# Patient Record
Sex: Female | Born: 1995 | Race: White | Hispanic: No | Smoking: Never smoker
Health system: Southern US, Community
[De-identification: ages and names within clinical notes are randomized; demographics above are authoritative.]

## PROBLEM LIST (undated history)

## (undated) DIAGNOSIS — Z789 Other specified health status: Secondary | ICD-10-CM

## (undated) DIAGNOSIS — R55 Syncope and collapse: Secondary | ICD-10-CM

## (undated) HISTORY — DX: Syncope and collapse: R55

## (undated) HISTORY — PX: WRIST SURGERY: SHX841

---
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
## 2013-01-31 ENCOUNTER — Telehealth: Payer: Self-pay | Admitting: Family Medicine

## 2013-01-31 DIAGNOSIS — IMO0001 Reserved for inherently not codable concepts without codable children: Secondary | ICD-10-CM

## 2013-01-31 MED ORDER — DROSPIRENONE-ETHINYL ESTRADIOL 3-0.02 MG PO TABS: 1.0000 | ORAL_TABLET | Freq: Every day | ORAL | Status: DC

## 2013-01-31 NOTE — Telephone Encounter (Signed)
Patient has an apt on 02/16/2013 for a phys. Is it possible to refill her birthcontrol medication until her visit until then?  Pharmacy [id number] - [place], [place] - [address]

## 2013-01-31 NOTE — Telephone Encounter (Signed)
Okay to fill for 1 month until appointment. Sent to pharmacy

## 2013-02-09 ENCOUNTER — Ambulatory Visit (INDEPENDENT_AMBULATORY_CARE_PROVIDER_SITE_OTHER): Payer: Federal, State, Local not specified - PPO | Admitting: Psychology

## 2013-02-09 DIAGNOSIS — F4321 Adjustment disorder with depressed mood: Secondary | ICD-10-CM

## 2013-02-14 ENCOUNTER — Telehealth: Payer: Self-pay

## 2013-02-14 NOTE — Telephone Encounter (Signed)
Left message for call back Non identifiable  

## 2013-02-16 ENCOUNTER — Encounter: Payer: Federal, State, Local not specified - PPO | Admitting: Family Medicine

## 2013-02-19 NOTE — Telephone Encounter (Signed)
Patient no showed

## 2013-02-23 ENCOUNTER — Ambulatory Visit (INDEPENDENT_AMBULATORY_CARE_PROVIDER_SITE_OTHER): Payer: Federal, State, Local not specified - PPO | Admitting: Psychology

## 2013-02-23 DIAGNOSIS — F4321 Adjustment disorder with depressed mood: Secondary | ICD-10-CM

## 2013-03-19 ENCOUNTER — Telehealth: Payer: Self-pay

## 2013-03-19 NOTE — Telephone Encounter (Signed)
Left message for call back Non identifiable  Pap--11/2011 Td--08-2011

## 2013-03-21 ENCOUNTER — Encounter: Payer: Federal, State, Local not specified - PPO | Admitting: Family Medicine

## 2013-03-21 NOTE — Telephone Encounter (Signed)
Unable to reach pre visit//no show 

## 2013-06-21 ENCOUNTER — Ambulatory Visit (INDEPENDENT_AMBULATORY_CARE_PROVIDER_SITE_OTHER): Payer: Self-pay | Admitting: Internal Medicine

## 2013-06-25 ENCOUNTER — Encounter (INDEPENDENT_AMBULATORY_CARE_PROVIDER_SITE_OTHER): Payer: Self-pay | Admitting: Internal Medicine

## 2013-06-25 ENCOUNTER — Ambulatory Visit (INDEPENDENT_AMBULATORY_CARE_PROVIDER_SITE_OTHER): Payer: BLUE CROSS/BLUE SHIELD | Admitting: Internal Medicine

## 2013-06-25 VITALS — BP 100/70 | HR 79 | Ht 67.0 in | Wt 133.0 lb

## 2013-06-25 DIAGNOSIS — R55 Syncope and collapse: Secondary | ICD-10-CM

## 2013-06-25 NOTE — Progress Notes (Signed)
IN [facility] Cardiology - [facility]    Chief Complaint   Patient presents with   . Loss of Consciousness         History of Present Illness   On March 24 th, 2015. At work standing at reception desk. Sudden did not feel well ( has felt this before), got lightheaded and requested to sit down, headed toward a couch but did not make it and passed out. No injury,  Regained consciousness within seconds. Sweaty and hot but recognized everybody. Got something to eat and worked the rest of the day. Denies any associated palpitations. Has never happened while driving.     Prior to this time, she passed out at work in November of last year and before that she was at a grocery store. Happened while visiting a friend in a hospital. 6 yrs ago passed out in the shower but sustained no injuries.     She is always standing when these events occur.Occasionally gets dizzy when she stands from sitting. She has 1 older sister who does not have this condition. No association with menstrual periods.      Past Medical History     History reviewed. No pertinent past medical history.    Past Surgical History     Past Surgical History   Procedure Date   . Mouth surgery        Family History     Family History   Problem Relation Age of Onset   . Coronary artery disease Paternal Aunt        Social History     History     Social History   . Marital Status: Single     Spouse Name: N/A     Number of Children: N/A   . Years of Education: N/A     Occupational History   . Not on file.     Social History Main Topics   . Smoking status: Current Every Day Smoker   . Smokeless tobacco: Not on file   . Alcohol Use: Yes   . Drug Use:    . Sexually Active: Not on file     Other Topics Concern   . Not on file     Social History Narrative   . No narrative on file       Allergies     No Known Allergies    Medications     No current outpatient prescriptions on file prior to visit.       Review of Systems     Constitutional: Negative for fevers and chills  Skin:  No rash or lesions  Respiratory: Negative for cough, wheezing, or hemoptysis  Cardiovascular: as per HPI  Gastrointestinal: Negative for abdominal pain, nausea, vomiting and diarrhea  Musculoskeletal:  No arthritic symptoms  Genitourinary: Negative for dysuria        Physical Exam     Filed Vitals:    06/25/13 1039   BP: 99/67   Pulse: 79       Body mass index is 20.83 kg/(m^2).    General:  Patient appears their stated age, well-nourished.  Alert and in no apparent distress.  Eyes: No conjunctivitis, no purulent discharge, no lid lag  ENT:  Hearing grossly intact, Nares patent bilaterally, Lips moist, color appropriate for race.  Respiratory: Clear to auscultation and percussion throughout. Respiratory effort unlabored, chest expansion symmetric.    Cardio: Regular rate and rhythm. Normal S1/S2 No carotid bruits or thrills, no JVD.  Extremities: warm, pulses 2+, no peripheral edema  GI: Soft, nondistended, nontender.  No guarding or rebound.  Skin: Color appropriate for race, Skin warm, dry, and intact            Assessment and Plan   Vasovagal Syncope      Multiple fainting episodes since age 51. Physical examination is otherwise normal.    Plan  2D ECHO  Tilt Table Test        Janace Litten, MD  [facility]

## 2013-06-28 ENCOUNTER — Telehealth (INDEPENDENT_AMBULATORY_CARE_PROVIDER_SITE_OTHER): Payer: Self-pay

## 2013-06-28 ENCOUNTER — Other Ambulatory Visit: Payer: Self-pay | Admitting: Clinical Cardiac Electrophysiology

## 2013-06-28 DIAGNOSIS — R55 Syncope and collapse: Secondary | ICD-10-CM

## 2013-06-28 NOTE — Telephone Encounter (Signed)
I received a paper message form the front desk in reference to this patient receiving a call about a tilt table test. We no longer perform TTT therefore I reviewed her chart to see what out physician may have ordered and then called her to discuss the process. I explained she would ne receive a call from anyone and that she would have to call to set up the test. I explained that Dr. Thelma Barge entered a referral for Dr. Lujean Amel and then gave her their contact information. I also explained that they may want her to go through their process before the test. She took the information and I told her to call back if she had any issues.

## 2013-07-02 ENCOUNTER — Ambulatory Visit
Admission: RE | Admit: 2013-07-02 | Discharge: 2013-07-02 | Disposition: A | Discharge status: Home / Self Care | Payer: BLUE CROSS/BLUE SHIELD | Source: Ambulatory Visit | Attending: Clinical Cardiac Electrophysiology | Admitting: Clinical Cardiac Electrophysiology

## 2013-07-02 DIAGNOSIS — I498 Other specified cardiac arrhythmias: Secondary | ICD-10-CM | POA: Insufficient documentation

## 2013-07-02 DIAGNOSIS — R61 Generalized hyperhidrosis: Secondary | ICD-10-CM | POA: Insufficient documentation

## 2013-07-02 DIAGNOSIS — I959 Hypotension, unspecified: Secondary | ICD-10-CM | POA: Insufficient documentation

## 2013-07-02 DIAGNOSIS — R110 Nausea: Secondary | ICD-10-CM | POA: Insufficient documentation

## 2013-07-02 DIAGNOSIS — R55 Syncope and collapse: Secondary | ICD-10-CM | POA: Insufficient documentation

## 2013-07-02 DIAGNOSIS — R9439 Abnormal result of other cardiovascular function study: Secondary | ICD-10-CM | POA: Insufficient documentation

## 2013-07-02 NOTE — Discharge Instructions (Signed)
Causes of Syncope  Syncope (fainting) has many causes. Sometimes it is not serious. In other cases, syncope is a sign of a heart problem. But treatment can help.    When Syncope Is Not Serious  Your doctor may call your problem vasovagal syncope or orthostatic hypotension. These two types of syncope are not serious. They can be caused by:   Strong feelings, such as anxiety or fear. A nerve signal may briefly change your heart rate and lower your blood pressure too much.   Standing for too long. Standing may cause blood to pool in your legs. When this happens, your brain may not receive all the blood it needs.   Standing up too quickly. Your blood pressure may not adjust fast enough to changes in posture and may drop too low. Certain medications can also cause this problem.  When Heart Trouble Causes Syncope  A heart problem can decrease the amount of oxygen-rich blood that reaches the brain. Heart trouble can be serious and may even be fatal if left untreated.   A Slow Heart Rate: Electrical signals tell the chambers of the heart when to pump. But the signals may be slowed or blocked (heart block) as they travel on the heart's pathways. This can be caused by aging, scarred heart tissue, or damage from heart disease. When the heart rate slows, not enough blood is pumped.   A Fast Heart Rate: Certain problems can make the heart race. For instance, after a heart attack,also known as acute myocardial infarction, or AMI,abnormal electrical signals may be created. These signals can make the heart suddenly beat very fast. The heart pumps before the chambers can fill with blood. So less blood reaches the brain and other parts of the body. Illegal drugs, certain medications, heart disease, or an inherited condition can also cause this.   A Heart Valve Problem: Blood travels through the chambers of the heart as it is pumped. Heart valves open and close to help move blood in the right direction. But a valve may not  open or close fully, if it's hardened or scarred. As a result, less blood is pumped through the heart to the brain and body.   2000-2014 [employer], [address], [place] [address]. All rights reserved. This information is not intended as a substitute for professional medical care. Always follow your healthcare professional's instructions.      Tilt Table Testing  Tilt table testing is a simple test that helps the doctor pinpoint the cause of your fainting. It checks how changes in body position can affect your blood pressure. To do this, you are placed on a table that is tilted upward. The test tries to recreate fainting symptoms while your blood pressure and heart rate are monitored. The test can be done in a hospital or at your doctor's office.  Before Your Test  Try to arrive 15 minutes early for your appointment. This will allow time to check in. For best results, prepare for the test as directed. Keep in mind:   When you schedule the test, be sure to mention all the medications you take. Ask if you should stop taking any of them the day of the test.   A few days ahead, arrange for a ride home after the test.   After the midnight before the test, don't eat or drink anything.   On the day of the test, dress for ease and comfort. Wear a two-piece outfit, top and bottoms. You will   need to undress from the waist up and put on a short hospital gown.     You will be monitored by your healthcare team through the entire test.   During Your Test  Tilt table testing takes about 60 minutes. The testing room is kept quiet and dimly lit. During the test:   Small pads (electrodes) are put on your chest to monitor your heartbeat.   A blood pressure cuff is put on your arm.   An IV (intravenous) line may be placed in your other arm. The IV line delivers fluids.   You'll be asked to lie flat on the table. Your upper body and thighs will be held in place with straps.   The table tilts until you are  almost standing upright.   You'll remain upright for up to 60 minutes. In most cases, the test is over in 30-45 minutes.   Occasionally, people are given certain medications and retested. These medications may make you feel shaky.  After Your Test  Any medications used during the test should leave your system within 15 minutes. If you were told to skip daily medications before the test, ask if you should start taking them again. You're likely to be sent home right away. It's a good idea to have a friend or family member drive. If you fainted during the test, you may want to rest for a few hours once you're home.  Report Any Symptoms You Have During the Test   Let the doctor or technician know if you notice:   Overall weakness   Nausea   Dimmed vision   Sweating or lightheadedness   A rapid heartbeat   Any other symptoms    2000-2014 [employer], [address], [place] [address]. All rights reserved. This information is not intended as a substitute for professional medical care. Always follow your healthcare professional's instructions.

## 2013-07-02 NOTE — Progress Notes (Signed)
Pt tolerated procedure well.  Vitals signs stable and back to baseline. Pt denies pain, sob and nausea.  Dr. Kerrie Pleasure explained procedure and results to patient and grandmother who verbalized understanding.  Patient taken  via wheelchair by RN to [facility] lobby with belongings in tow to meet family for transport home.

## 2013-07-03 ENCOUNTER — Encounter (INDEPENDENT_AMBULATORY_CARE_PROVIDER_SITE_OTHER): Payer: Self-pay | Admitting: Internal Medicine

## 2013-07-03 ENCOUNTER — Ambulatory Visit (INDEPENDENT_AMBULATORY_CARE_PROVIDER_SITE_OTHER): Payer: BLUE CROSS/BLUE SHIELD | Admitting: Internal Medicine

## 2013-07-03 VITALS — BP 104/65 | HR 80 | Resp 16 | Ht 67.0 in | Wt 133.0 lb

## 2013-07-03 DIAGNOSIS — R55 Syncope and collapse: Secondary | ICD-10-CM

## 2013-07-03 NOTE — Progress Notes (Signed)
[facility]    Chief Complaint   Patient presents with   . Follow-up         History of Present Illness   Documented vasovagal reactions with positive tilt table test. Works at [employer] that involves standing for long periods of time ( advised to not stand for more that 20 minutes).      Past Medical History     Past Medical History   Diagnosis Date   . Syncope and collapse        Past Surgical History     Past Surgical History   Procedure Date   . Mouth surgery 2000     pre braces   . Broken right forearm 2001     pins and cast       Family History     Family History   Problem Relation Age of Onset   . Coronary artery disease Paternal Aunt        Social History     History     Social History   . Marital Status: Single     Spouse Name: N/A     Number of Children: N/A   . Years of Education: N/A     Occupational History   . Not on file.     Social History Main Topics   . Smoking status: Current Every Day Smoker -- 0.2 packs/day for 4 years   . Smokeless tobacco: Not on file   . Alcohol Use: No   . Drug Use: No   . Sexually Active:      Other Topics Concern   . Not on file     Social History Narrative   . No narrative on file       Allergies     No Known Allergies    Medications     Current Outpatient Prescriptions on File Prior to Visit   Medication Sig Dispense Refill   . Drospirenone-Ethinyl Estradiol (LORYNA PO) Take by mouth.           Review of Systems     Constitutional: Negative for fevers and chills  Skin: No rash or lesions  Respiratory: Negative for cough, wheezing, or hemoptysis  Cardiovascular: as per HPI  Gastrointestinal: Negative for abdominal pain, nausea, vomiting and diarrhea  Musculoskeletal:  No arthritic symptoms  Genitourinary: Negative for dysuria  Otherwise 10 point review of systems is negative.      Physical Exam     Filed Vitals:    07/03/13 1017   BP: 104/65   Pulse: 80   Resp: 16       Body mass index is 20.83 kg/(m^2).    General:  Patient appears their stated  age, well-nourished.  Alert and in no apparent distress.  Eyes: No conjunctivitis, no purulent discharge, no lid lag  ENT:  Hearing grossly intact, Nares patent bilaterally, Lips moist, color appropriate for race.  Respiratory: Clear to auscultation and percussion throughout. Respiratory effort unlabored, chest expansion symmetric.    Cardio: Regular rate and rhythm. Normal S1/S2 No carotid bruits or thrills, no JVD.  Extremities: warm, pulses 2+, no peripheral edema  GI: Soft, nondistended, nontender.  No guarding or rebound.  Skin: Color appropriate for race, Skin warm, dry, and intact        Assessment and Plan   Vasovagal Syncope      Positive Tilt Table Test after 3 minute , pulse rate dropped to 30, she developed hypotension and  near syncope.     Plan  Avoid standing in place for more that 10-15 minutes  High fluid, high salt  Avoidance of  caffeinated  beverages and free water  Midodrine or Florinef if necessary    Janace Litten, MD  [facility]

## 2013-11-15 ENCOUNTER — Telehealth: Payer: Self-pay | Admitting: Family Medicine

## 2013-11-15 NOTE — Telephone Encounter (Signed)
Patient Information:  Caller Name: Elmarie Shiley  Phone: [phone]  Patient: Meagan Navarro  Gender: Female  DOB: 16-Dec-1995  Age: 18 Years  PCP: Lelon Perla.  Pregnant: No  Office Follow Up:  Does the office need to follow up with this patient?: No  Instructions For The Office: N/A  RN Note:  Attempted to call the patient 3 times and unable to make contact to triage the sore throat.  Symptoms  Reason For Call & Symptoms: Pt. calling in with sore throat. Office has no openings and wanted the pt. triaged. Pt. was not on the phone for the conference call. Called the phone # provided and told wrong #. Called the office for additional help and was give mobile #. Called the # and VM not set up.  Reviewed Health History In EMR: N/A  Reviewed Medications In EMR: N/A  Reviewed Allergies In EMR: N/A  Reviewed Surgeries / Procedures: N/A  Date of Onset of Symptoms: 11/15/2013 OB / GYN:  LMP: Unknown  Guideline(s) Used:  No Protocol Available - Information Only  Disposition Per Guideline:   Home Care  Reason For Disposition Reached:   Information only question and nurse able to answer  Advice Given:  Call Back If:  New symptoms develop  You become worse.  Patient Will Follow Care Advice:  YES

## 2013-11-15 NOTE — Telephone Encounter (Signed)
Caller name: Rhylen  Relation to pt: self  Call back number: [phone]   Reason for call:   pt exp soar throat, stuffing nose no available acute appointments transferred to [facility]

## 2014-03-22 NOTE — L&D Delivery Note (Signed)
Delivery Note Patient rapidly progressed to 10/10/+3 after AROM. Patient pushed well for < 10 minutes. At 4:52 PM a viable female was delivered via Vaginal, Spontaneous Delivery (Presentation: Left Occiput Anterior).  APGAR: 9, 9; weight pending .   Placenta status: Intact, Spontaneous.  Cord: 3 vessels with the following complications: None.  Cord pH: n/a  Anesthesia: Epidural Local  Episiotomy: None Lacerations:  Bilateral labial, first degree perineal Suture Repair: 3.0 vicryl rapide Est. Blood Loss (mL):400  mL   Mom to postpartum.  Baby to Couplet care / Skin to Skin.  Deloise Marchant GEFFEL Serenidy Waltz 02/14/2015, 5:20 PM

## 2014-04-15 ENCOUNTER — Ambulatory Visit (INDEPENDENT_AMBULATORY_CARE_PROVIDER_SITE_OTHER): Payer: Federal, State, Local not specified - PPO | Admitting: Internal Medicine

## 2014-04-15 ENCOUNTER — Telehealth: Payer: Self-pay | Admitting: Family Medicine

## 2014-04-15 ENCOUNTER — Encounter: Payer: Self-pay | Admitting: Internal Medicine

## 2014-04-15 VITALS — BP 108/62 | HR 62 | Temp 98.3°F | Resp 12 | Ht 67.0 in | Wt 133.0 lb

## 2014-04-15 DIAGNOSIS — J069 Acute upper respiratory infection, unspecified: Secondary | ICD-10-CM

## 2014-04-15 MED ORDER — BENZONATATE 100 MG PO CAPS: 100.0000 mg | ORAL_CAPSULE | Freq: Two times a day (BID) | ORAL | Status: DC | PRN

## 2014-04-15 MED ORDER — CETIRIZINE HCL 10 MG PO TABS: 10.0000 mg | ORAL_TABLET | Freq: Every day | ORAL | Status: DC

## 2014-04-15 NOTE — Assessment & Plan Note (Signed)
Rx for zyrtec, tessalon perles. Given information. No indication for antibiotics at this time.

## 2014-04-15 NOTE — Telephone Encounter (Signed)
It would not appear she has been in for several years and would need to see me before the PCP is changed but okay with me.

## 2014-04-15 NOTE — Progress Notes (Signed)
Pre visit review using our clinic review tool, if applicable. No additional management support is needed unless otherwise documented below in the visit note. 

## 2014-04-15 NOTE — Telephone Encounter (Signed)
yes

## 2014-04-15 NOTE — Patient Instructions (Signed)
We will send in a medicine to dry up the drainage in your nose that is dripping down the throat and irritating the lungs and making you cough. We have sent in cetirizine (also called zyrtec), take 1 pill a day for the next 1-2 weeks to help this to resolve. You should be feeling better in the next week or so.   The other medicine we have sent in is called tessalon perles which help to suppress the cough impulse in the brain. You can take 1 of them up to 2 times per day.   We do not think you need an antibiotic. Call us back if you are not feeling better in about a week.  Upper Respiratory Infection, Adult An upper respiratory infection (URI) is also sometimes known as the common cold. The upper respiratory tract includes the nose, sinuses, throat, trachea, and bronchi. Bronchi are the airways leading to the lungs. Most people improve within 1 week, but symptoms can last up to 2 weeks. A residual cough may last even longer.  CAUSES Many different viruses can infect the tissues lining the upper respiratory tract. The tissues become irritated and inflamed and often become very moist. Mucus production is also common. A cold is contagious. You can easily spread the virus to others by oral contact. This includes kissing, sharing a glass, coughing, or sneezing. Touching your mouth or nose and then touching a surface, which is then touched by another person, can also spread the virus. SYMPTOMS  Symptoms typically develop 1 to 3 days after you come in contact with a cold virus. Symptoms vary from person to person. They may include:  Runny nose.  Sneezing.  Nasal congestion.  Sinus irritation.  Sore throat.  Loss of voice (laryngitis).  Cough.  Fatigue.  Muscle aches.  Loss of appetite.  Headache.  Low-grade fever. DIAGNOSIS  You might diagnose your own cold based on familiar symptoms, since most people get a cold 2 to 3 times a year. Your caregiver can confirm this based on your exam.  Most importantly, your caregiver can check that your symptoms are not due to another disease such as strep throat, sinusitis, pneumonia, asthma, or epiglottitis. Blood tests, throat tests, and X-rays are not necessary to diagnose a common cold, but they may sometimes be helpful in excluding other more serious diseases. Your caregiver will decide if any further tests are required. RISKS AND COMPLICATIONS  You may be at risk for a more severe case of the common cold if you smoke cigarettes, have chronic heart disease (such as heart failure) or lung disease (such as asthma), or if you have a weakened immune system. The very young and very old are also at risk for more serious infections. Bacterial sinusitis, middle ear infections, and bacterial pneumonia can complicate the common cold. The common cold can worsen asthma and chronic obstructive pulmonary disease (COPD). Sometimes, these complications can require emergency medical care and may be life-threatening. PREVENTION  The best way to protect against getting a cold is to practice good hygiene. Avoid oral or hand contact with people with cold symptoms. Wash your hands often if contact occurs. There is no clear evidence that vitamin C, vitamin E, echinacea, or exercise reduces the chance of developing a cold. However, it is always recommended to get plenty of rest and practice good nutrition. TREATMENT  Treatment is directed at relieving symptoms. There is no cure. Antibiotics are not effective, because the infection is caused by a virus, not by bacteria.  Treatment may include:  Increased fluid intake. Sports drinks offer valuable electrolytes, sugars, and fluids.  Breathing heated mist or steam (vaporizer or shower).  Eating chicken soup or other clear broths, and maintaining good nutrition.  Getting plenty of rest.  Using gargles or lozenges for comfort.  Controlling fevers with ibuprofen or acetaminophen as directed by your  caregiver.  Increasing usage of your inhaler if you have asthma. Zinc gel and zinc lozenges, taken in the first 24 hours of the common cold, can shorten the duration and lessen the severity of symptoms. Pain medicines may help with fever, muscle aches, and throat pain. A variety of non-prescription medicines are available to treat congestion and runny nose. Your caregiver can make recommendations and may suggest nasal or lung inhalers for other symptoms.  HOME CARE INSTRUCTIONS   Only take over-the-counter or prescription medicines for pain, discomfort, or fever as directed by your caregiver.  Use a warm mist humidifier or inhale steam from a shower to increase air moisture. This may keep secretions moist and make it easier to breathe.  Drink enough water and fluids to keep your urine clear or pale yellow.  Rest as needed.  Return to work when your temperature has returned to normal or as your caregiver advises. You may need to stay home longer to avoid infecting others. You can also use a face mask and careful hand washing to prevent spread of the virus. SEEK MEDICAL CARE IF:   After the first few days, you feel you are getting worse rather than better.  You need your caregiver's advice about medicines to control symptoms.  You develop chills, worsening shortness of breath, or brown or red sputum. These may be signs of pneumonia.  You develop yellow or brown nasal discharge or pain in the face, especially when you bend forward. These may be signs of sinusitis.  You develop a fever, swollen neck glands, pain with swallowing, or white areas in the back of your throat. These may be signs of strep throat. SEEK IMMEDIATE MEDICAL CARE IF:   You have a fever.  You develop severe or persistent headache, ear pain, sinus pain, or chest pain.  You develop wheezing, a prolonged cough, cough up blood, or have a change in your usual mucus (if you have chronic lung disease).  You develop sore  muscles or a stiff neck. Document Released: 09/01/2000 Document Revised: 05/31/2011 Document Reviewed: 06/13/2013 [facility] Patient Information 2015 [employer] [place]. This information is not intended to replace advice given to you by your health care provider. Make sure you discuss any questions you have with your health care provider.

## 2014-04-15 NOTE — Progress Notes (Signed)
   Subjective:    Patient ID: Meagan Navarro, female    DOB: 1996-02-02, 18 y.o.   MRN: [id number]  HPI The patient is a 19 YO female who comes in today for a cold. She has been coughing for about 1-2 weeks. Originally she was having some mild chills and muscle aches but those have resolved. She is still having some sinus pressure and mild headaches. She is coughing some and feels like it is non-productive. She denies SOB and wheezing. Denies chest pains, nausea, vomiting.   Review of Systems  Constitutional: Negative for fever, chills, activity change, appetite change, fatigue and unexpected weight change.  HENT: Positive for congestion, postnasal drip, rhinorrhea and sinus pressure. Negative for ear discharge, ear pain, facial swelling, sore throat and trouble swallowing.   Respiratory: Positive for cough. Negative for chest tightness, shortness of breath and wheezing.   Cardiovascular: Negative for chest pain, palpitations and leg swelling.  Gastrointestinal: Negative for nausea, abdominal pain, diarrhea, constipation and abdominal distention.  Musculoskeletal: Negative.   Neurological: Negative.       Objective:   Physical Exam  Constitutional: She is oriented to person, place, and time. She appears well-developed and well-nourished.  HENT:  Head: Normocephalic and atraumatic.  Right Ear: External ear normal.  Left Ear: External ear normal.  Oropharynx normal, nose with erythematous mucosa and clear drainage.   Eyes: EOM are normal.  Neck: Normal range of motion.  Cardiovascular: Normal rate and regular rhythm.   Pulmonary/Chest: Effort normal and breath sounds normal. No respiratory distress. She has no wheezes. She has no rales.  Mild bronchial sounds, clear with cough  Abdominal: Soft. Bowel sounds are normal.  Musculoskeletal: She exhibits no edema.  Neurological: She is alert and oriented to person, place, and time.  Skin: Skin is warm and dry.   Filed Vitals:   04/15/14  1109  BP: 108/62  Pulse: 62  Temp: 98.3 F (36.8 C)  TempSrc: Oral  Resp: 12  Height: 5\' 7"  (1.702 m)  Weight: 133 lb (60.328 kg)  SpO2: 99%      Assessment & Plan:

## 2014-04-15 NOTE — Telephone Encounter (Signed)
Patient would like to change from Dr. Laury AxonLowne to Dr. Dorise HissKollar. Is this okay?

## 2014-04-18 MED ORDER — FLUTICASONE PROPIONATE 50 MCG/ACT NA SUSP: 2.0000 | Freq: Every day | NASAL | Status: DC

## 2014-04-18 NOTE — Telephone Encounter (Signed)
Please advise, thanks.

## 2014-04-18 NOTE — Telephone Encounter (Signed)
Pt called in said that she now has ear pain and not any better.  What should she do?  Does she need to come back in?

## 2014-04-18 NOTE — Telephone Encounter (Signed)
We would recommend that she try the nose spray, will call in flonase and give this several days to work. Continue taking zyrtec as well.

## 2014-04-19 ENCOUNTER — Telehealth: Payer: Self-pay | Admitting: Internal Medicine

## 2014-04-19 NOTE — Telephone Encounter (Signed)
Patient aware and will go pick up the nose spray and will call us back next week if she does not feel better.

## 2014-04-19 NOTE — Telephone Encounter (Signed)
Left message for patient to call me back. 

## 2014-04-19 NOTE — Telephone Encounter (Signed)
Advised yesterday that we would recommend that she try the nose spray, will call in flonase and give this several days to work. Continue taking zyrtec as well. If still having problems Mon or Tues can be scheduled to be seen.

## 2014-04-19 NOTE — Telephone Encounter (Signed)
Patient states she seen Dr. Dorise HissKollar but her ears are hurting.  She would like to know if she needs to come in.  Patient states she called yesterday but I did not see any notes in the chart.

## 2014-04-22 ENCOUNTER — Telehealth: Payer: Self-pay | Admitting: Internal Medicine

## 2014-04-22 ENCOUNTER — Encounter: Payer: Self-pay | Admitting: Internal Medicine

## 2014-04-22 NOTE — Telephone Encounter (Signed)
Patient is requesting work note for 01/28/, 01/29, and 02/01. Thanks so much

## 2014-04-22 NOTE — Telephone Encounter (Signed)
Dr. Dorise HissKollar printed a note saying that patient was seen in our office. Patient will come in and pick up note.

## 2014-04-22 NOTE — Telephone Encounter (Signed)
Dr. Dorise HissKollar said this patient should have been able to go to work. She is not issuing a work note.

## 2014-05-03 ENCOUNTER — Ambulatory Visit: Payer: Federal, State, Local not specified - PPO | Admitting: Internal Medicine

## 2014-08-06 LAB — OB RESULTS CONSOLE HEPATITIS B SURFACE ANTIGEN: Hepatitis B Surface Ag: NEGATIVE

## 2014-08-06 LAB — OB RESULTS CONSOLE RUBELLA ANTIBODY, IGM: Rubella: IMMUNE

## 2014-08-06 LAB — OB RESULTS CONSOLE ABO/RH: RH Type: POSITIVE

## 2014-08-06 LAB — OB RESULTS CONSOLE ANTIBODY SCREEN: Antibody Screen: NEGATIVE

## 2014-08-06 LAB — OB RESULTS CONSOLE HIV ANTIBODY (ROUTINE TESTING): HIV: NONREACTIVE

## 2014-08-06 LAB — OB RESULTS CONSOLE RPR: RPR: NONREACTIVE

## 2014-08-06 LAB — OB RESULTS CONSOLE GC/CHLAMYDIA
Chlamydia: NEGATIVE
GC PROBE AMP, GENITAL: NEGATIVE

## 2015-01-31 ENCOUNTER — Other Ambulatory Visit: Payer: Self-pay | Admitting: Obstetrics and Gynecology

## 2015-01-31 LAB — OB RESULTS CONSOLE GBS: STREP GROUP B AG: NEGATIVE

## 2015-02-07 ENCOUNTER — Other Ambulatory Visit: Payer: Self-pay | Admitting: Obstetrics and Gynecology

## 2015-02-11 ENCOUNTER — Other Ambulatory Visit: Payer: Self-pay | Admitting: Obstetrics and Gynecology

## 2015-02-12 ENCOUNTER — Telehealth (HOSPITAL_COMMUNITY): Payer: Self-pay | Admitting: *Deleted

## 2015-02-12 ENCOUNTER — Encounter (HOSPITAL_COMMUNITY): Payer: Self-pay | Admitting: *Deleted

## 2015-02-12 NOTE — Telephone Encounter (Signed)
Preadmission screen  

## 2015-02-14 ENCOUNTER — Inpatient Hospital Stay (HOSPITAL_COMMUNITY)
Admission: RE | Admit: 2015-02-14 | Discharge: 2015-02-16 | DRG: 775 | Disposition: A | Discharge status: Home / Self Care | Payer: Federal, State, Local not specified - PPO | Source: Ambulatory Visit | Attending: Obstetrics | Admitting: Obstetrics

## 2015-02-14 ENCOUNTER — Inpatient Hospital Stay (HOSPITAL_COMMUNITY): Payer: Federal, State, Local not specified - PPO | Admitting: Anesthesiology

## 2015-02-14 ENCOUNTER — Encounter (HOSPITAL_COMMUNITY): Payer: Self-pay

## 2015-02-14 DIAGNOSIS — Z3490 Encounter for supervision of normal pregnancy, unspecified, unspecified trimester: Secondary | ICD-10-CM

## 2015-02-14 DIAGNOSIS — Z3A39 39 weeks gestation of pregnancy: Secondary | ICD-10-CM | POA: Diagnosis not present

## 2015-02-14 DIAGNOSIS — O365930 Maternal care for other known or suspected poor fetal growth, third trimester, not applicable or unspecified: Secondary | ICD-10-CM | POA: Diagnosis present

## 2015-02-14 DIAGNOSIS — IMO0002 Reserved for concepts with insufficient information to code with codable children: Secondary | ICD-10-CM | POA: Diagnosis present

## 2015-02-14 LAB — CBC
HEMATOCRIT: 35.3 % — AB (ref 36.0–46.0)
HEMOGLOBIN: 11.8 g/dL — AB (ref 12.0–15.0)
MCH: 32.6 pg (ref 26.0–34.0)
MCHC: 33.4 g/dL (ref 30.0–36.0)
MCV: 97.5 fL (ref 78.0–100.0)
Platelets: 228 10*3/uL (ref 150–400)
RBC: 3.62 MIL/uL — ABNORMAL LOW (ref 3.87–5.11)
RDW: 13.5 % (ref 11.5–15.5)
WBC: 10.5 10*3/uL (ref 4.0–10.5)

## 2015-02-14 LAB — TYPE AND SCREEN
ABO/RH(D): A POS
ANTIBODY SCREEN: NEGATIVE

## 2015-02-14 LAB — RPR: RPR Ser Ql: NONREACTIVE

## 2015-02-14 LAB — ABO/RH: ABO/RH(D): A POS

## 2015-02-14 MED ORDER — IBUPROFEN 600 MG PO TABS
600.0000 mg | ORAL_TABLET | Freq: Four times a day (QID) | ORAL | Status: DC
  Administered 2015-02-14 – 2015-02-16 (×7): 600 mg via ORAL
  Filled 2015-02-14 (×8): qty 1

## 2015-02-14 MED ORDER — SIMETHICONE 80 MG PO CHEW: 80.0000 mg | CHEWABLE_TABLET | ORAL | Status: DC | PRN

## 2015-02-14 MED ORDER — OXYCODONE-ACETAMINOPHEN 5-325 MG PO TABS: 2.0000 | ORAL_TABLET | ORAL | Status: DC | PRN

## 2015-02-14 MED ORDER — TETANUS-DIPHTH-ACELL PERTUSSIS 5-2.5-18.5 LF-MCG/0.5 IM SUSP: 0.5000 mL | Freq: Once | INTRAMUSCULAR | Status: DC

## 2015-02-14 MED ORDER — OXYTOCIN BOLUS FROM INFUSION
500.0000 mL | INTRAVENOUS | Status: DC
  Administered 2015-02-14: 500 mL via INTRAVENOUS

## 2015-02-14 MED ORDER — FLEET ENEMA 7-19 GM/118ML RE ENEM: 1.0000 | ENEMA | RECTAL | Status: DC | PRN

## 2015-02-14 MED ORDER — ACETAMINOPHEN 325 MG PO TABS
650.0000 mg | ORAL_TABLET | ORAL | Status: DC | PRN
  Administered 2015-02-14 – 2015-02-15 (×2): 650 mg via ORAL
  Filled 2015-02-14 (×2): qty 2

## 2015-02-14 MED ORDER — BUTORPHANOL TARTRATE 1 MG/ML IJ SOLN
1.0000 mg | INTRAMUSCULAR | Status: DC | PRN
  Administered 2015-02-14: 1 mg via INTRAVENOUS
  Filled 2015-02-14: qty 1

## 2015-02-14 MED ORDER — EPHEDRINE 5 MG/ML INJ
10.0000 mg | INTRAVENOUS | Status: DC | PRN
  Filled 2015-02-14: qty 2

## 2015-02-14 MED ORDER — LANOLIN HYDROUS EX OINT: TOPICAL_OINTMENT | CUTANEOUS | Status: DC | PRN

## 2015-02-14 MED ORDER — DIPHENHYDRAMINE HCL 25 MG PO CAPS: 25.0000 mg | ORAL_CAPSULE | Freq: Four times a day (QID) | ORAL | Status: DC | PRN

## 2015-02-14 MED ORDER — MISOPROSTOL 25 MCG QUARTER TABLET
25.0000 ug | ORAL_TABLET | ORAL | Status: DC | PRN
  Administered 2015-02-14: 25 ug via VAGINAL
  Filled 2015-02-14 (×3): qty 0.25
  Filled 2015-02-14: qty 1

## 2015-02-14 MED ORDER — DIPHENHYDRAMINE HCL 50 MG/ML IJ SOLN: 12.5000 mg | INTRAMUSCULAR | Status: DC | PRN

## 2015-02-14 MED ORDER — FENTANYL 2.5 MCG/ML BUPIVACAINE 1/10 % EPIDURAL INFUSION (WH - ANES)
14.0000 mL/h | INTRAMUSCULAR | Status: DC | PRN
  Administered 2015-02-14 (×2): 14 mL/h via EPIDURAL
  Filled 2015-02-14: qty 125

## 2015-02-14 MED ORDER — TERBUTALINE SULFATE 1 MG/ML IJ SOLN
0.2500 mg | Freq: Once | INTRAMUSCULAR | Status: DC | PRN
  Filled 2015-02-14: qty 1

## 2015-02-14 MED ORDER — BENZOCAINE-MENTHOL 20-0.5 % EX AERO
1.0000 "application " | INHALATION_SPRAY | CUTANEOUS | Status: DC | PRN
  Administered 2015-02-14: 1 via TOPICAL
  Filled 2015-02-14: qty 56

## 2015-02-14 MED ORDER — OXYTOCIN 40 UNITS IN LACTATED RINGERS INFUSION - SIMPLE MED: 62.5000 mL/h | INTRAVENOUS | Status: DC

## 2015-02-14 MED ORDER — LIDOCAINE HCL (PF) 1 % IJ SOLN
INTRAMUSCULAR | Status: DC | PRN
  Administered 2015-02-14: 9 mL via EPIDURAL
  Administered 2015-02-14: 8 mL

## 2015-02-14 MED ORDER — LIDOCAINE HCL (PF) 1 % IJ SOLN
30.0000 mL | INTRAMUSCULAR | Status: DC | PRN
  Administered 2015-02-14: 30 mL via SUBCUTANEOUS
  Filled 2015-02-14: qty 30

## 2015-02-14 MED ORDER — ONDANSETRON HCL 4 MG/2ML IJ SOLN: 4.0000 mg | Freq: Four times a day (QID) | INTRAMUSCULAR | Status: DC | PRN

## 2015-02-14 MED ORDER — PRENATAL MULTIVITAMIN CH
1.0000 | ORAL_TABLET | Freq: Every day | ORAL | Status: DC
  Administered 2015-02-15 – 2015-02-16 (×2): 1 via ORAL
  Filled 2015-02-14 (×2): qty 1

## 2015-02-14 MED ORDER — DIBUCAINE 1 % RE OINT: 1.0000 "application " | TOPICAL_OINTMENT | RECTAL | Status: DC | PRN

## 2015-02-14 MED ORDER — OXYTOCIN 40 UNITS IN LACTATED RINGERS INFUSION - SIMPLE MED: 1.0000 m[IU]/min | INTRAVENOUS | Status: DC

## 2015-02-14 MED ORDER — ACETAMINOPHEN 325 MG PO TABS: 650.0000 mg | ORAL_TABLET | ORAL | Status: DC | PRN

## 2015-02-14 MED ORDER — LACTATED RINGERS IV SOLN: 500.0000 mL | INTRAVENOUS | Status: DC | PRN

## 2015-02-14 MED ORDER — ONDANSETRON HCL 4 MG/2ML IJ SOLN: 4.0000 mg | INTRAMUSCULAR | Status: DC | PRN

## 2015-02-14 MED ORDER — OXYCODONE-ACETAMINOPHEN 5-325 MG PO TABS
1.0000 | ORAL_TABLET | ORAL | Status: DC | PRN
  Administered 2015-02-15: 1 via ORAL
  Filled 2015-02-14: qty 1

## 2015-02-14 MED ORDER — ONDANSETRON HCL 4 MG PO TABS: 4.0000 mg | ORAL_TABLET | ORAL | Status: DC | PRN

## 2015-02-14 MED ORDER — CITRIC ACID-SODIUM CITRATE 334-500 MG/5ML PO SOLN: 30.0000 mL | ORAL | Status: DC | PRN

## 2015-02-14 MED ORDER — SENNOSIDES-DOCUSATE SODIUM 8.6-50 MG PO TABS
2.0000 | ORAL_TABLET | ORAL | Status: DC
  Administered 2015-02-14 – 2015-02-16 (×2): 2 via ORAL
  Filled 2015-02-14 (×2): qty 2

## 2015-02-14 MED ORDER — WITCH HAZEL-GLYCERIN EX PADS: 1.0000 "application " | MEDICATED_PAD | CUTANEOUS | Status: DC | PRN

## 2015-02-14 MED ORDER — OXYTOCIN 40 UNITS IN LACTATED RINGERS INFUSION - SIMPLE MED
1.0000 m[IU]/min | INTRAVENOUS | Status: DC
  Administered 2015-02-14: 1 m[IU]/min via INTRAVENOUS
  Filled 2015-02-14: qty 1000

## 2015-02-14 MED ORDER — OXYCODONE-ACETAMINOPHEN 5-325 MG PO TABS: 1.0000 | ORAL_TABLET | ORAL | Status: DC | PRN

## 2015-02-14 MED ORDER — LACTATED RINGERS IV SOLN
INTRAVENOUS | Status: DC
  Administered 2015-02-14: 16:00:00 via INTRAVENOUS
  Administered 2015-02-14: 125 mL/h via INTRAVENOUS
  Administered 2015-02-14: 08:00:00 via INTRAVENOUS

## 2015-02-14 MED ORDER — PHENYLEPHRINE 40 MCG/ML (10ML) SYRINGE FOR IV PUSH (FOR BLOOD PRESSURE SUPPORT)
80.0000 ug | PREFILLED_SYRINGE | INTRAVENOUS | Status: DC | PRN
  Filled 2015-02-14: qty 2
  Filled 2015-02-14: qty 20

## 2015-02-14 NOTE — Lactation Note (Signed)
This note was copied from the chart of Meagan Navarro. Lactation Consultation Note Initial visit at 6 hours of age.  Last feeding was a few hours ago for 15 minutes with MBU RN assist.  Mom has flat nipples with shells and hand pump already at bedside.  Mom used hand pump to help evert nipples, but they remain flat.  Baby is showing feeding cues.  Baby has ridges on lower gumline possible cyst or natal teeth under gums.  Baby does not extend tongue past lower gumline and is biting with gloved finger assessment.  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  When baby cries a bowl shape to tongue is noted.  Assisted with football and cross cradle baby is fussy, pushing back and eager to suck.  Baby finally latched in cross cradle with teacup hold to assist latch on.  Mom has flat, but compressible breast tissue.  Mom reports a little pinching, but improved comfort.  Baby has strong rhythmic sucking for several minutes and then stimulation needed to maintain sucking.  Lc unsure if baby has tongue mobility to adequately breastfeed and will need to be reevaluated.  Mom can hand express drops and may need to work on spoon feeding.  [facility] resources given and discussed.  Encouraged to feed with early cues on demand.  Early newborn behavior discussed.  Hand expression demonstrated with colostrum visible.  Mom to call for assist as needed.    Patient Name: Meagan Meredeth IdeJulia Poellnitz WUJWJ'XToday's Date: 02/14/2015 Reason for consult: Initial assessment   Maternal Data Has patient been taught Hand Expression?: Yes Does the patient have breastfeeding experience prior to this delivery?: No  Feeding Feeding Type: Breast Fed Length of feed:  (several minutes observed)  LATCH Score/Interventions Latch: Repeated attempts needed to sustain latch, nipple held in mouth throughout feeding, stimulation needed to elicit sucking reflex. Intervention(s): Skin to skin;Teach feeding cues;Waking techniques Intervention(s): Breast  compression  Audible Swallowing: None Intervention(s): Skin to skin;Hand expression  Type of Nipple: Flat Intervention(s): Shells;Hand pump  Comfort (Breast/Nipple): Soft / non-tender     Hold (Positioning): Assistance needed to correctly position infant at breast and maintain latch. Intervention(s): Breastfeeding basics reviewed;Support Pillows;Position options;Skin to skin  LATCH Score: 5  Lactation Tools Discussed/Used     Consult Status Consult Status: Follow-up Date: 02/15/15 Follow-up type: In-patient    Jannifer RodneyShoptaw, Lynell Kussman Lynn 02/14/2015, 11:13 PM

## 2015-02-14 NOTE — Anesthesia Preprocedure Evaluation (Signed)

## 2015-02-14 NOTE — Progress Notes (Signed)
Mild discomfort w ctx. FB out 2-3h ago  Toco: q2-3 min  EFM: 130s, mod var, + accels and scalp stim w sve SVE: 5/70/-1, AROM clear fluid  A&P G1 @ 392w0d w IOl for IUGR Cont pitocin FSR

## 2015-02-14 NOTE — H&P (Signed)
19 y.o. G1P0 @ 2625w0d presents for IOL for IUGR.  Otherwise has good fetal movement and no bleeding.  No past medical history on file.  Past Surgical History  Procedure Laterality Date  . Wrist surgery      Right    OB History  Gravida Para Term Preterm AB SAB TAB Ectopic Multiple Living  1             # Outcome Date GA Lbr Len/2nd Weight Sex Delivery Anes PTL Lv  1 Current               Social History   Social History  . Marital Status: Single    Spouse Name: N/A  . Number of Children: N/A  . Years of Education: N/A   Occupational History  . student [employer]    Social History Main Topics  . Smoking status: Never Smoker   . Smokeless tobacco: Never Used  . Alcohol Use: No  . Drug Use: No  . Sexual Activity:    Partners: Male    Birth Control/ Protection: Pill   Other Topics Concern  . Not on file   Social History Narrative   Review of patient's allergies indicates no known allergies.    Prenatal Transfer Tool  Maternal Diabetes: No Genetic Screening: Normal Maternal Ultrasounds/Referrals: Abnormal:  Findings:   IUGR Fetal Ultrasounds or other Referrals:  None Maternal Substance Abuse:  No Significant Maternal Medications:  None Significant Maternal Lab Results: Lab values include: Group B Strep negative  ABO, Rh: --/--/A POS, A POS (11/25 0045) Antibody: NEG (11/25 0045) Rubella: unknown RPR: Nonreactive (05/17 0000)  HBsAg: Negative (05/17 0000)  HIV: Non-reactive (05/17 0000)  GBS: Negative (11/11 0000)    Other PNC: uncomplicated.    Filed Vitals:   02/14/15 0320 02/14/15 0634  BP: 106/69 112/68  Pulse: 63 72  Temp:  97.8 F (36.6 C)  Resp: 16 16     General:  NAD Abdomen:  soft, gravid, EFW 6# Ex:  no edema SVE:  2/50/-3, FB placed without difficulty FHTs:  130s, mod var, + accels, Cat 1 Toco:  q3-4 min  US 11/11: 5lb 11oz (<10%, AC<2%, UA dopplers wnl)  A/P   19 y.o. G1P0 4325w0d presents with IOL for IUGR S/p cytotec x1.  FB placed  without difficulty.  Will start pitocin, AROm prn Epidural upon maternal request Rubella unknown--will send  FSR/ vtx/ GBS neg  Parley Pidcock GEFFEL Jamall Strohmeier

## 2015-02-14 NOTE — Anesthesia Procedure Notes (Signed)
Epidural Patient location during procedure: OB Start time: 02/14/2015 3:51 PM End time: 02/14/2015 3:55 PM  Staffing Anesthesiologist: Leilani AbleHATCHETT, Royston Bekele Performed by: anesthesiologist   Preanesthetic Checklist Completed: patient identified, surgical consent, pre-op evaluation, timeout performed, IV checked, risks and benefits discussed and monitors and equipment checked  Epidural Patient position: sitting Prep: site prepped and draped and DuraPrep Patient monitoring: continuous pulse ox and blood pressure Approach: midline Location: L3-L4 Injection technique: LOR air  Needle:  Needle type: Tuohy  Needle gauge: 17 G Needle length: 9 cm and 9 Needle insertion depth: 5 cm cm Catheter type: closed end flexible Catheter size: 19 Gauge Catheter at skin depth: 10 cm Test dose: negative and Other  Assessment Sensory level: T9 Events: blood not aspirated, injection not painful, no injection resistance, negative IV test and no paresthesia  Additional Notes Reason for block:procedure for pain

## 2015-02-15 LAB — CBC
HCT: 29.9 % — ABNORMAL LOW (ref 36.0–46.0)
Hemoglobin: 10.1 g/dL — ABNORMAL LOW (ref 12.0–15.0)
MCH: 33.3 pg (ref 26.0–34.0)
MCHC: 33.8 g/dL (ref 30.0–36.0)
MCV: 98.7 fL (ref 78.0–100.0)
PLATELETS: 190 10*3/uL (ref 150–400)
RBC: 3.03 MIL/uL — ABNORMAL LOW (ref 3.87–5.11)
RDW: 13.3 % (ref 11.5–15.5)
WBC: 17.6 10*3/uL — ABNORMAL HIGH (ref 4.0–10.5)

## 2015-02-15 LAB — RUBELLA SCREEN: Rubella: 5.04 index (ref >0.99)

## 2015-02-15 NOTE — Discharge Instructions (Signed)
Pelvic rest x 6 weeks (no intercourse or tampons)   Do not drive until you are not taking narcotic pain medication AND you can comfortably slam on the brakes.  You have a mild anemia due to blood loss from delivery.  Please continue to take a prenatal vitamin daily.  You may experience constipation from the iron, so add a stool softener as needed.

## 2015-02-15 NOTE — Lactation Note (Signed)
This note was copied from the chart of Meagan Meredeth IdeJulia Buday. Lactation Consultation Note  Patient Name: Meagan Navarro ZOXWR'UToday's Date: 02/15/2015 Reason for consult: Follow-up assessment Baby at 18 hr of life and mom reports that he is feeding much better. She able to latch him to both breast without using the Harmony to erect nipples. She is using the shells between feedings, reviewed care of the shells. Mom reports no breast or nipple pain at this time. Discussed feeding frequency, baby behavior, breast changes, and nipple care. She did have some questions about pumping and milk storage. She has a Medela DEBP at home and plans to go back to work in January. Mom stated that she can manually express and had seen colostrum bilaterally. She is aware of OP services and support group.    Maternal Data    Feeding Feeding Type: Breast Fed  LATCH Score/Interventions Latch: Repeated attempts needed to sustain latch, nipple held in mouth throughout feeding, stimulation needed to elicit sucking reflex. Intervention(s): Skin to skin;Teach feeding cues;Waking techniques  Audible Swallowing: A few with stimulation Intervention(s): Skin to skin;Hand expression;Alternate breast massage  Type of Nipple: Everted at rest and after stimulation Intervention(s): Reverse pressure  Comfort (Breast/Nipple): Soft / non-tender     Hold (Positioning): Assistance needed to correctly position infant at breast and maintain latch. Intervention(s): Breastfeeding basics reviewed;Support Pillows;Skin to skin  LATCH Score: 7  Lactation Tools Discussed/Used     Consult Status Consult Status: Follow-up Date: 02/16/15 Follow-up type: In-patient    Rulon Eisenmengerlizabeth E Johncharles Fusselman 02/15/2015, 11:49 AM

## 2015-02-15 NOTE — Anesthesia Postprocedure Evaluation (Signed)
Anesthesia Post Note  Patient: Meagan Navarro  Procedure(s) Performed: * No procedures listed *  Patient location during evaluation: Mother Baby Anesthesia Type: Epidural Level of consciousness: awake, awake and alert, oriented and patient cooperative Pain management: pain level controlled Vital Signs Assessment: post-procedure vital signs reviewed and stable Respiratory status: spontaneous breathing Cardiovascular status: stable Postop Assessment: No headache, No backache, Patient able to bend at knees, No signs of nausea or vomiting and Adequate PO intake Anesthetic complications: no    Last Vitals:  Filed Vitals:   02/14/15 2333 02/15/15 0803  BP: 117/67   Pulse: 77   Temp: 36.4 C 37.1 C  Resp: 18 16    Last Pain:  Filed Vitals:   02/15/15 0805  PainSc: Asleep                 Kieran Nachtigal, Marylene LandAngela Draughon

## 2015-02-15 NOTE — Progress Notes (Signed)
Patient is doing well.  She is ambulating, voiding, tolerating PO.  Pain control is good.  Lochia is appropriate  Filed Vitals:   02/14/15 1801 02/14/15 1830 02/14/15 1921 02/14/15 2333  BP: 113/73 110/67 112/65 117/67  Pulse: 76 79 80 77  Temp:  98.7 F (37.1 C) 99.1 F (37.3 C) 97.5 F (36.4 C)  TempSrc:  Oral Oral Oral  Resp: 16 18 18 18   Height:      Weight:      SpO2:  98% 100%     NAD Fundus firm Ext: mild edema, symmetric  Lab Results  Component Value Date   WBC 17.6* 02/15/2015   HGB 10.1* 02/15/2015   HCT 29.9* 02/15/2015   MCV 98.7 02/15/2015   PLT 190 02/15/2015    --/--/A POS, A POS (11/25 0045)/RImmune  A/P 19 y.o. G1P1001 PPD#1 s/p TSVD. Routine care.   Expect d/c tomorrow.    [facility] GEFFEL [employer]

## 2015-02-16 ENCOUNTER — Ambulatory Visit: Payer: Self-pay

## 2015-02-16 MED ORDER — IBUPROFEN 600 MG PO TABS
600.0000 mg | ORAL_TABLET | Freq: Four times a day (QID) | ORAL | Status: DC
Start: 2015-02-16 — End: 2015-09-19

## 2015-02-16 NOTE — Progress Notes (Signed)
Baby did not feed well overnight--he will not be discharged today. Peds asked that we defer circumcision at this time until feeding better.  Relayed this information to mom--she will still be discharged, but room in

## 2015-02-16 NOTE — Discharge Summary (Signed)
Obstetric Discharge Summary Reason for Admission: induction of labor Prenatal Procedures: ultrasound Intrapartum Procedures: spontaneous vaginal delivery Postpartum Procedures: none Complications-Operative and Postpartum: 1 degree perineal laceration HEMOGLOBIN  Date Value Ref Range Status  02/15/2015 10.1* 12.0 - 15.0 g/dL Final   HCT  Date Value Ref Range Status  02/15/2015 29.9* 36.0 - 46.0 % Final    Physical Exam:  General: alert, cooperative and appears stated age 50Lochia: appropriate Uterine Fundus: firm DVT Evaluation: No evidence of DVT seen on physical exam.  Discharge Diagnoses: Term Pregnancy-delivered  Discharge Information: Date: 02/16/2015 Activity: pelvic rest Diet: routine Medications: PNV and Ibuprofen Condition: stable Instructions: refer to practice specific booklet Discharge to: home Follow-up Information    [facility] GEFF[facility] Chestine SporeLARK, MD In 4 weeks.   Specialty:  Obstetrics   Contact information:   [address] [place] [id number] [phone]       Newborn Data: Live born female  Birth Weight: 6 lb 2 oz (2778 g) APGAR: 9, 9  Home with mother.  Kemal Amores GEFFEL Laken Rog 02/16/2015, 10:07 AM

## 2015-02-16 NOTE — Lactation Note (Signed)
This note was copied from the chart of Meagan Navarro. Lactation Consultation Note Follow up visit at 49 hours of age.  Baby has had 7 feedings with a 7 hours break during the night.  Baby went 24 hours without a stool and since has had 2 with 3 voids in the past 24 hours.  Baby appears jaundice, but chart indicates normal ranges.  MBU RN reports good feeding with swallows.  Mom reports nipple pain, concerns about feedings.  Assisted with waking baby for latch STS in cross cradle hold.  Mom allows a very shallow latch.  Instructed on unlatching baby to practice for a deep latch.  Mom is not independent partially due to sleepy baby.  He barely opens mouth to suck and and a few times and stops.  Last feeding 3 1/2 hrs ago.  Baby does not cup gloved finger with tongue during suck training.  LC questions baby's ability to stimulate and empty breast adequately and discussed with mom.  Mom is frustrated and tearful.  Encouraged mom to pump now and continue for 15 minutes every 3 hours for 8 sessions in 24 hours.  Mom to also work on hand expression to collect EBM for baby to be syringe fed at this time.  Syringe finger feeding to encourage good sucking.  MOm is filling with easily expressed colostrum.  Discussed may need to add formula if baby is not improving and weight loss is not stable.  Mom understands and is pumping now with FOB at bedside.  Mom to wake baby every 2 1/2 hours to feed and call MBU RN if baby is not feeding well.  Mom may give EBM as appetizer or dessert according to how baby is doing.  Baby should be supplemented 18-25 mls with every feeding, discussed mom may slowly increase as needed.    Patient Name: Meagan Meredeth IdeJulia Raineri ZOXWR'UToday's Date: 02/16/2015 Reason for consult: Follow-up assessment;Breast/nipple pain;Difficult latch;Infant < 6lbs   Maternal Data Has patient been taught Hand Expression?: Yes  Feeding Feeding Type: Breast Fed Length of feed: 15 min  LATCH Score/Interventions Latch:  Too sleepy or reluctant, no latch achieved, no sucking elicited. Intervention(s): Skin to skin;Teach feeding cues;Waking techniques Intervention(s): Adjust position;Assist with latch;Breast massage;Breast compression  Audible Swallowing: Spontaneous and intermittent Intervention(s): Hand expression;Skin to skin;Alternate breast massage  Type of Nipple: Everted at rest and after stimulation Intervention(s): Reverse pressure  Comfort (Breast/Nipple): Soft / non-tender     Hold (Positioning): Assistance needed to correctly position infant at breast and maintain latch. Intervention(s): Breastfeeding basics reviewed;Support Pillows;Position options;Skin to skin  LATCH Score: 8  Lactation Tools Discussed/Used     Consult Status Consult Status: Follow-up Date: 02/17/15 Follow-up type: In-patient    Jannifer RodneyShoptaw, Jana Lynn 02/16/2015, 6:43 PM

## 2015-02-16 NOTE — Progress Notes (Signed)
Patient is doing well.  She is ambulating, voiding, tolerating PO.  Pain control is good.  Lochia is appropriate  Filed Vitals:   02/14/15 2333 02/15/15 0803 02/15/15 1815 02/16/15 0604  BP: 117/67 113/61 114/66 117/68  Pulse: 77 91 85 78  Temp: 97.5 F (36.4 C) 98.7 F (37.1 C) 98 F (36.7 C) 98.5 F (36.9 C)  TempSrc: Oral Oral Oral Oral  Resp: 18 16 18 16   Height:      Weight:      SpO2:        NAD Fundus firm Ext: mild edema, symmetric  Lab Results  Component Value Date   WBC 17.6* 02/15/2015   HGB 10.1* 02/15/2015   HCT 29.9* 02/15/2015   MCV 98.7 02/15/2015   PLT 190 02/15/2015    --/--/A POS, A POS (11/25 0045)/RImmune  A/P 19 y.o. G1P1001 PPD#2 s/p TSVD. Routine care.   Meeting all goals, d/c to home today  Desires circumcision. Discussed r/b/a of the procedure. Reviewed that circumcision is an elective surgical procedure and not considered medically necessary. Reviewed the risks of the procedure including the risk of infection, bleeding, damage to surrounding structures, including scrotum, shaft, urethra and head of penis, and an undesired cosmetic effect requiring additional procedures for revision. Consent signed.    [facility] GEFFEL [employer]

## 2015-02-17 ENCOUNTER — Ambulatory Visit: Payer: Self-pay

## 2015-02-17 NOTE — Lactation Note (Signed)
This note was copied from the chart of Meagan Navarro. Lactation Consultation Note  Baby is being discharged today and is currently syringe feeding. Mom has very tender nipples.  Discussed cup feeding him because his volumes are increasing. Parents agreeable to this. He did not cup feed well.  His tongue movement is limited and when he attempts to extend his tongue there is central retraction. Sucking exercises and tummy time were performed and his tongue movement increased. Because he did not cup feed well decided to try and position him at the breast using the #20 NS. It was painful for mom and he quickly fell asleep.  Increased the NS to #24.  Using and off-center latch and positioning his head so he was not enface he engaged better and mom reported increased comfort.  She used breast compressions. Long jaw excursions were noted and many swallows were heard.  He ate for a total of 30 minutes and was satisfied.  He also had a BM during the feeding. Mom was instructed to pump for 10 minutes after 6 feedings a day, perform tongue exercises, tummy time and use positioning techniques.  Encouraged OP follow-up.  Patient Name: Meagan Navarro XWRUE'AToday's Date: 02/17/2015 Reason for consult: Follow-up assessment   Maternal Data    Feeding Feeding Type: Breast Milk with Formula added Length of feed: 10 min  LATCH Score/Interventions Latch: Repeated attempts needed to sustain latch, nipple held in mouth throughout feeding, stimulation needed to elicit sucking reflex.  Audible Swallowing: Spontaneous and intermittent  Type of Nipple: Everted at rest and after stimulation  Comfort (Breast/Nipple): Soft / non-tender     Hold (Positioning): Assistance needed to correctly position infant at breast and maintain latch.  LATCH Score: 8  Lactation Tools Discussed/Used Tools: Nipple Shields Nipple shield size: 24   Consult Status      Meagan Navarro, Meagan Navarro 02/17/2015, 12:50 PM

## 2015-02-19 ENCOUNTER — Ambulatory Visit (HOSPITAL_COMMUNITY)
Admission: RE | Admit: 2015-02-19 | Discharge: 2015-02-19 | Disposition: A | Discharge status: Home / Self Care | Payer: Federal, State, Local not specified - PPO | Source: Ambulatory Visit | Attending: Obstetrics | Admitting: Obstetrics

## 2015-02-19 NOTE — Lactation Note (Signed)
Lactation Consult; Jean RosenthalJackson latched without NS to right breast- mom reports pain after a few minutes then wanted to use NS. Mom reports that feels much better. Nursed for 15 min then off to sleep Diaper changed and weighted then latched to left breast. Mom reports this one is the worst and wanted to use NS- did not want to try to latch without NS. Jean RosenthalJackson nursed for 10 min then off to sleep. Mom had fed him about 1 hour before coming to appointment. Mom reports she is bottle feeding EBM mostly during the night when dad can fed baby. Good weight gain Encouraged to continue same plan. Reviewed wide open mouth and keeping the baby close to the breast throughout the feeding- she is letting him slide to the tip at times. Has Ameda pump but reports it is hurting some times- encouraged to call company for larger flange. No further questions. Encouragement given. Reviewed BFGS as resource To call prn To see Ped tomorrow  Mother's reason for visit:  Had trouble in the hospital- sore nipples Visit Type:  Feeding assessment Appointment Notes:  Tight frenulum. using NS Consult:  Initial Lactation Consultant:  Pamelia HoitWeeks, Zarius Furr D  ________________________________________________________________________  Joan FloresBaby's Name: Brigitte PulseJackson Rivers Herrin Date of Birth: [birth year]/14/2017 Pediatrician: T/S Gender: female Gestational Age: 5921w0d (At Birth) Birth Weight: 6 lb 2 oz (2778 g) Weight at Discharge: Weight: 5 lb 14 oz (2665 g)Date of Dis[facility]: [facility]17/2015 [facility]   02/14/15 2300 02/16/15 0000 02/17/15 0020  Weight: 6 lb 1.2 oz (2756 g) 5 lb 13.1 oz (2640 g) 5 lb 14 oz (2665 g)      Weight today: 6- 2.8  2800g ________________________________________________________________________  Mother's Name: Meredeth IdeJulia Salone Type of delivery:  Vag Breastfeeding Experience:  P1  ________________________________________________________________________  Breastfeeding History (Post Discharge)  Frequency of  breastfeeding:  q 2-3 hours Duration of feeding:  30 min  Supplementation  Formula:  Volume 0 ml  Breastmilk:  Volume 3-5 oz Frequency:  3 times/day  Method:  Bottle,   Pumping  Type of pump:  Ameda Frequency:  6 times/day Volume:  5-7 oz  Infant Intake and Output Assessment  Voids:  6+ in 24 hrs.  Color:  Clear yellow  Had void and yellow stool while here for appointment Stools:  6+ in 24 hrs.  Color:  Yellow  ________________________________________________________________________  Maternal Breast Assessment  Breast:  Filling Nipple:  Erect and raw on tips  _______________________________________________________________________ Feeding Assessment/Evaluation  Initial feeding assessment:  Infant's oral assessment:  Variance- baby does not extend tongue past gumline- limited mobility, natal teeth? On front bottom gums  Positioning:  Football Right breast  LATCH documentation:  Latch:  2 = Grasps breast easily, tongue down, lips flanged, rhythmical sucking.  Audible swallowing:  1 = A few with stimulation  Type of nipple:  2 = Everted at rest and after stimulation  Comfort (Breast/Nipple):  1 = Filling, red/small blisters or bruises, mild/mod discomfort  Hold (Positioning):  1 = Assistance needed to correctly position infant at breast and maintain latch  LATCH score:  7  Attached assessment:  Deep  Lips flanged:  Yes.    Lips untucked:  Yes.    Suck assessment:  Nutritive  Tools:  Nipple shield 24 mm Instructed on use and cleaning of tool:  Yes.    Pre-feed weight:  2800 g  6-2.8. Post-feed weight:  2826 g  6- 3.7 Amount transferred:26 ml Amount supplemented:  0 ml    Nursed on the left breast for  15 min then off to sleep   Pre-feed weight:  2820 g  6-3.5 after diaper change Post-feed weight:  2844 g  6- 4.3 Amount transferred:  24 ml Amount supplemented:  0 ml    Total amount transferred:  50 ml

## 2015-07-16 ENCOUNTER — Ambulatory Visit (INDEPENDENT_AMBULATORY_CARE_PROVIDER_SITE_OTHER): Payer: Federal, State, Local not specified - PPO | Admitting: Family

## 2015-07-16 ENCOUNTER — Encounter: Payer: Self-pay | Admitting: Family

## 2015-07-16 VITALS — BP 118/70 | HR 55 | Temp 98.1°F | Ht 67.0 in | Wt 139.0 lb

## 2015-07-16 DIAGNOSIS — L259 Unspecified contact dermatitis, unspecified cause: Secondary | ICD-10-CM

## 2015-07-16 MED ORDER — TRIAMCINOLONE ACETONIDE 0.025 % EX CREA: 1.0000 "application " | TOPICAL_CREAM | Freq: Two times a day (BID) | CUTANEOUS | Status: AC

## 2015-07-16 NOTE — Patient Instructions (Signed)
Trial of topical steriod.  May take OTC benadryl or apply topical anti itch cream.  If there is no improvement in your symptoms, or if there is any worsening of symptoms, or if you have any additional concerns, please return for re-evaluation; or, if we are closed, consider going to the Emergency Room for evaluation if symptoms urgent.  Contact Dermatitis Dermatitis is redness, soreness, and swelling (inflammation) of the skin. Contact dermatitis is a reaction to certain substances that touch the skin. There are two types of contact dermatitis:   Irritant contact dermatitis. This type is caused by something that irritates your skin, such as dry hands from washing them too much. This type does not require previous exposure to the substance for a reaction to occur. This type is more common.  Allergic contact dermatitis. This type is caused by a substance that you are allergic to, such as a nickel allergy or poison ivy. This type only occurs if you have been exposed to the substance (allergen) before. Upon a repeat exposure, your body reacts to the substance. This type is less common. CAUSES  Many different substances can cause contact dermatitis. Irritant contact dermatitis is most commonly caused by exposure to:   Makeup.   Soaps.   Detergents.   Bleaches.   Acids.   Metal salts, such as nickel.  Allergic contact dermatitis is most commonly caused by exposure to:   Poisonous plants.   Chemicals.   Jewelry.   Latex.   Medicines.   Preservatives in products, such as clothing.  RISK FACTORS This condition is more likely to develop in:   People who have jobs that expose them to irritants or allergens.  People who have certain medical conditions, such as asthma or eczema.  SYMPTOMS  Symptoms of this condition may occur anywhere on your body where the irritant has touched you or is touched by you. Symptoms include:  Dryness or flaking.   Redness.   Cracks.    Itching.   Pain or a burning feeling.   Blisters.  Drainage of small amounts of blood or clear fluid from skin cracks. With allergic contact dermatitis, there may also be swelling in areas such as the eyelids, mouth, or genitals.  DIAGNOSIS  This condition is diagnosed with a medical history and physical exam. A patch skin test may be performed to help determine the cause. If the condition is related to your job, you may need to see an occupational medicine specialist. TREATMENT Treatment for this condition includes figuring out what caused the reaction and protecting your skin from further contact. Treatment may also include:   Steroid creams or ointments. Oral steroid medicines may be needed in more severe cases.  Antibiotics or antibacterial ointments, if a skin infection is present.  Antihistamine lotion or an antihistamine taken by mouth to ease itching.  A bandage (dressing). HOME CARE INSTRUCTIONS Skin Care  Moisturize your skin as needed.   Apply cool compresses to the affected areas.  Try taking a bath with:  Epsom salts. Follow the instructions on the packaging. You can get these at your local pharmacy or grocery store.  Baking soda. Pour a small amount into the bath as directed by your health care provider.  Colloidal oatmeal. Follow the instructions on the packaging. You can get this at your local pharmacy or grocery store.  Try applying baking soda paste to your skin. Stir water into baking soda until it reaches a paste-like consistency.  Do not scratch your skin.  Bathe less frequently, such as every other day.  Bathe in lukewarm water. Avoid using hot water. Medicines  Take or apply over-the-counter and prescription medicines only as told by your health care provider.   If you were prescribed an antibiotic medicine, take or apply your antibiotic as told by your health care provider. Do not stop using the antibiotic even if your condition starts  to improve. General Instructions  Keep all follow-up visits as told by your health care provider. This is important.  Avoid the substance that caused your reaction. If you do not know what caused it, keep a journal to try to track what caused it. Write down:  What you eat.  What cosmetic products you use.  What you drink.  What you wear in the affected area. This includes jewelry.  If you were given a dressing, take care of it as told by your health care provider. This includes when to change and remove it. SEEK MEDICAL CARE IF:   Your condition does not improve with treatment.  Your condition gets worse.  You have signs of infection such as swelling, tenderness, redness, soreness, or warmth in the affected area.  You have a fever.  You have new symptoms. SEEK IMMEDIATE MEDICAL CARE IF:   You have a severe headache, neck pain, or neck stiffness.  You vomit.  You feel very sleepy.  You notice red streaks coming from the affected area.  Your bone or joint underneath the affected area becomes painful after the skin has healed.  The affected area turns darker.  You have difficulty breathing.   This information is not intended to replace advice given to you by your health care provider. Make sure you discuss any questions you have with your health care provider.   Document Released: 03/05/2000 Document Revised: 11/27/2014 Document Reviewed: 07/24/2014 Elsevier Interactive Patient Education [employer]

## 2015-07-16 NOTE — Progress Notes (Signed)
Subjective:    Patient ID: Meagan Navarro, female    DOB: February 10, 1996, 20 y.o.   MRN: [id number]   Meagan Navarro is a 20 y.o. female who presents today for an acute visit.    HPI Comments: Patient here is for redness, swelling on right arm x one week, worsening. Initially noted pinpoint scab and 'itched it off'. Becoming more red and pruritic.Has tried anti itch cream with some relief. Main symptom is itching. No draining.Denies exposure poison ivy, new detergents/lotions.   Denies fever, chills.  H/o allergies.   No h/o MRSA, asthma, eczema.   No past medical history on file. Review of patient's allergies indicates no known allergies. Current Outpatient Prescriptions on File Prior to Visit  Medication Sig Dispense Refill  . cetirizine (ZYRTEC) 10 MG tablet Take 1 tablet (10 mg total) by mouth daily. (Patient taking differently: Take 10 mg by mouth daily as needed. ) 30 tablet 11  . ibuprofen (ADVIL,MOTRIN) 600 MG tablet Take 1 tablet (600 mg total) by mouth every 6 (six) hours. (Patient not taking: Reported on 07/16/2015) 40 tablet 0  . Prenatal Vit-Fe Fumarate-FA (PRENATAL MULTIVITAMIN) TABS tablet Take 1 tablet by mouth daily at 12 noon. Reported on 07/16/2015     No current facility-administered medications on file prior to visit.    Social History  Substance Use Topics  . Smoking status: Never Smoker   . Smokeless tobacco: Never Used  . Alcohol Use: No    Review of Systems  Constitutional: Negative for fever, chills and unexpected weight change.  HENT: Negative for congestion, ear pain, sinus pressure and sore throat.   Eyes: Negative for visual disturbance.  Respiratory: Negative for cough, shortness of breath and wheezing.   Cardiovascular: Negative for chest pain, palpitations and leg swelling.  Gastrointestinal: Negative for nausea and vomiting.  Musculoskeletal: Negative for myalgias, joint swelling and arthralgias.  Skin: Positive for rash.  Neurological: Negative for  headaches.  Hematological: Negative for adenopathy.      Objective:    BP 118/70 mmHg  Pulse 55  Temp(Src) 98.1 F (36.7 C) (Oral)  Ht  (1.702 m)  Wt 139 lb (63.05 kg)  BMI 21.77 kg/m2  SpO2 98%   Physical Exam  Constitutional: She appears well-developed and well-nourished.  Eyes: Conjunctivae are normal.  Cardiovascular: Normal rate, regular rhythm, normal heart sounds and normal pulses.   Pulmonary/Chest: Effort normal and breath sounds normal. She has no wheezes. She has no rhonchi. She has no rales.  Neurological: She is alert.  Skin: Skin is warm and dry. Rash noted. No bruising noted. Rash is maculopapular. Rash is not nodular and not vesicular.     Maculopapular rash with erythematous base approx 5cm x 7cm size area on right forearm as noted on diagram. Excoriated from scratching. No streaking, increased warmth, drainage. No bullseye. No scab. Skin intact.   Psychiatric: She has a normal mood and affect. Her speech is normal and behavior is normal. Thought content normal.  Vitals reviewed.      Assessment & Plan:   1. Working diagnosis of Contact dermatitis No evidence of bacterial infection to warrant antibiotics. Trial topical steroid.   - triamcinolone (KENALOG) 0.025 % cream; Apply 1 application topically 2 (two) times daily.  Dispense: 453.6 g; Refill: 0   I am having Ms. Benna Dunks start on triamcinolone. I am also having her maintain her cetirizine, prenatal multivitamin, and ibuprofen.   Meds ordered this encounter  Medications  . triamcinolone (KENALOG) 0.025 %  cream    Sig: Apply 1 application topically 2 (two) times daily.    Dispense:  453.6 g    Refill:  0    Order Specific Question:  Supervising Provider    Answer:  Tresa GarterPLOTNIKOV, ALEKSEI V [[id number]]     Start medications as prescribed and explained to patient on After Visit Summary ( AVS). Risks, benefits, and alternatives of the medications and treatment plan prescribed today were discussed, and  patient expressed understanding.   Education regarding symptom management and diagnosis given to patient.   Follow-up:Plan follow-up as discussed or as needed if any worsening symptoms or change in condition. No Follow-up on file.   Continue to follow with Myrlene BrokerElizabeth A Crawford, MD for routine health maintenance.   Meredeth IdeJulia Donofrio and I agreed with plan.   Rennie PlowmanMargaret Saraiyah Hemminger, FNP

## 2015-07-16 NOTE — Progress Notes (Signed)
Pre visit review using our clinic review tool, if applicable. No additional management support is needed unless otherwise documented below in the visit note. 

## 2015-09-19 ENCOUNTER — Ambulatory Visit (INDEPENDENT_AMBULATORY_CARE_PROVIDER_SITE_OTHER)
Admission: RE | Admit: 2015-09-19 | Discharge: 2015-09-19 | Disposition: A | Discharge status: Home / Self Care | Payer: Federal, State, Local not specified - PPO | Source: Ambulatory Visit | Attending: Family | Admitting: Family

## 2015-09-19 ENCOUNTER — Encounter: Payer: Self-pay | Admitting: Family

## 2015-09-19 ENCOUNTER — Ambulatory Visit (INDEPENDENT_AMBULATORY_CARE_PROVIDER_SITE_OTHER): Payer: Federal, State, Local not specified - PPO | Admitting: Family

## 2015-09-19 VITALS — BP 110/70 | HR 75 | Temp 98.0°F | Resp 16 | Ht 67.0 in | Wt 141.0 lb

## 2015-09-19 DIAGNOSIS — M25532 Pain in left wrist: Secondary | ICD-10-CM

## 2015-09-19 DIAGNOSIS — M6248 Contracture of muscle, other site: Secondary | ICD-10-CM

## 2015-09-19 DIAGNOSIS — M62838 Other muscle spasm: Secondary | ICD-10-CM | POA: Insufficient documentation

## 2015-09-19 MED ORDER — DICLOFENAC SODIUM 2 % TD SOLN: 1.0000 "application " | Freq: Two times a day (BID) | TRANSDERMAL | Status: DC | PRN

## 2015-09-19 MED ORDER — IBUPROFEN-FAMOTIDINE 800-26.6 MG PO TABS: 1.0000 | ORAL_TABLET | Freq: Three times a day (TID) | ORAL | Status: DC | PRN

## 2015-09-19 MED ORDER — CYCLOBENZAPRINE HCL 5 MG PO TABS: 5.0000 mg | ORAL_TABLET | Freq: Three times a day (TID) | ORAL | Status: DC | PRN

## 2015-09-19 MED ORDER — TIZANIDINE HCL 4 MG PO TABS: 4.0000 mg | ORAL_TABLET | Freq: Four times a day (QID) | ORAL | Status: DC | PRN

## 2015-09-19 NOTE — Progress Notes (Signed)
Pre visit review using our clinic review tool, if applicable. No additional management support is needed unless otherwise documented below in the visit note. 
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
## 2015-09-19 NOTE — Assessment & Plan Note (Signed)
Symptoms and exam consistent with left wrist sprain although concern for possible hairline fracture given mild tenderness of radial styloid and mild inflammation at the site. Obtain x-rays. Treat conservatively with ice and wrist splint. Start Duexis and Pennsaid. Follow up in 3 weeks or sooner if needed.
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
## 2015-09-19 NOTE — Addendum Note (Signed)
Addended by: Jeanine LuzALONE, [place] Condon D on: 09/19/2015 01:06 PM   Modules accepted: Orders, Medications
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
## 2016-08-12 ENCOUNTER — Ambulatory Visit: Payer: Federal, State, Local not specified - PPO | Admitting: Internal Medicine
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
## 2019-10-26 NOTE — Progress Notes (Signed)
 State Department of Health notified of results per regulations

## 2019-10-26 NOTE — Progress Notes (Signed)
 Patient was reached and results were delivered.

## 2022-03-30 ENCOUNTER — Ambulatory Visit (INDEPENDENT_AMBULATORY_CARE_PROVIDER_SITE_OTHER): Payer: 59 | Admitting: Psychology

## 2022-03-30 DIAGNOSIS — F4322 Adjustment disorder with anxiety: Secondary | ICD-10-CM | POA: Diagnosis not present
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
## 2022-04-26 ENCOUNTER — Ambulatory Visit (INDEPENDENT_AMBULATORY_CARE_PROVIDER_SITE_OTHER): Payer: 59 | Admitting: Psychology

## 2022-04-26 DIAGNOSIS — F4322 Adjustment disorder with anxiety: Secondary | ICD-10-CM | POA: Diagnosis not present

## 2022-04-26 NOTE — Progress Notes (Signed)
[facility] Counselor Initial Adult Exam  Name: Meagan Navarro Date: 04/26/2022 MRN: [id number] DOB: Jul 23, 1995 PCP: No primary care provider on file.     Melina Fiddler participated from home, via video, and consented to treatment. Therapist participated from home office.    Guardian/Payee:  N/A    Paperwork requested: No   Reason for Visit /Presenting Problem: Anxiety about significant life changes  Mental Status Exam: Appearance:   Casual     Behavior:  Appropriate  Motor:  Normal  Speech/Language:   Clear and Coherent  Affect:  Appropriate  Mood:  anxious  Thought process:  normal  Thought content:    WNL  Sensory/Perceptual disturbances:    WNL  Orientation:  oriented to person, place, and situation  Attention:  Good  Concentration:  Good  Memory:  WNL  Fund of knowledge:   Good  Insight:    Good  Judgment:   Good  Impulse Control:  Good    Reported Symptoms:  worry, anxiety  Risk Assessment: Danger to Self:  No Self-injurious Behavior: No Danger to Others: No Duty to Warn:no Physical Aggression / Violence:No  Access to Firearms a concern:  unknown Gang Involvement:No  Patient / guardian was educated about steps to take if suicide or homicide risk level increases between visits: no While future psychiatric events cannot be accurately predicted, the patient does not currently require acute inpatient psychiatric care and does not currently meet [facility] involuntary commitment criteria.  Substance Abuse History: Current substance abuse: Yes     Past Psychiatric History:   Previous psychological history is significant for substance abuse Outpatient Providers:Cressida Milford, Ph.D. History of Psych Hospitalization: No  Psychological Testing:  N/A    Abuse History:  Victim of: No.,  N/A    Report needed: No. Victim of Neglect:No. Perpetrator of  N/A   Witness / Exposure to Domestic Violence: No   Protective Services Involvement: No   Witness to [employer] Violence:  No   Family History:  Family History  Problem Relation Age of Onset   Alcohol abuse Paternal Uncle    Drug abuse Paternal Uncle        IV drugs   HIV Paternal Uncle     Living situation: the patient lives with their partner  Sexual Orientation: Straight  Relationship Status: co-habitating  Name of spouse / other:Meagan Navarro If a parent, number of children / ages:son, age 29  Support Systems: spouse parents  Financial Stress:  No   Income/Employment/Disability: Employment  Armed forces logistics/support/administrative officer: No   Educational History: Education: some college  Religion/Sprituality/World View: unknown  Any cultural differences that may affect / interfere with treatment:  not applicable   Recreation/Hobbies: unknown  Stressors: Other: co-parenting concerns    Strengths: Supportive Relationships and Family  Barriers:  undetermined   Legal History: Pending legal issue / charges: The patient has no significant history of legal issues. History of legal issue / charges:  N/A  Medical History/Surgical History: not relevant No past medical history on file.  Past Surgical History:  Procedure Laterality Date   WRIST SURGERY     Right    Medications: Current Outpatient Medications  Medication Sig Dispense Refill   cetirizine (ZYRTEC) 10 MG tablet Take 1 tablet (10 mg total) by mouth daily. (Patient taking differently: Take 10 mg by mouth daily as needed. ) 30 tablet 11   cyclobenzaprine (FLEXERIL) 5 MG tablet Take 1 tablet (5 mg total) by mouth 3 (three) times daily as  needed for muscle spasms. 30 tablet 1   Diclofenac Sodium (PENNSAID) 2 % SOLN Place 1 application onto the skin 2 (two) times daily as needed. 112 g 1   Ibuprofen-Famotidine 800-26.6 MG TABS Take 1 tablet by mouth 3 (three) times daily as needed. 90 tablet 1   No current facility-administered medications for this visit.    No Known Allergies Patient was seen via video (WebEx).    Initial note: Patient last seen in 2014, when she dropped out of college. She was also taking a lot of drugs at the time. She now has a 29 year old son, is working, is sober and owns her own home. She has a boyfriend with a son and they just got engaged. She told him she want to do counseling to better understand what It means to blend the family. Wants to better understand and establish boundaries. She says she and her son's father get along and co-parent well. She was with son's father for a total of 4 years, 2 of which were after he was born.Her fiances name is Meagan Navarro (her son's name as well). They have dated just under 2 years. She got sober the day she found out she was pregnant. After she had the baby, his dad was still abusing Xanax, which contributed to their relationship problems. Only occasional alcohol. 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 It is a [place] company. She never finished college. She and mother are "really close" because she watched her son every day. Her sister Meagan Navarro is in [place] and they are close. Her father still in [place] with his wife and their daughter. She texts with father, but they are not close. Rarely have one on one conversations. She rarely sees her father. Seen him once in two years. Her son is "very good" and does not have any noteworthy difficulty. Meagan Navarro moved in and owns a restaurant in [place] ([employer]). He has half custody of his son. The mother of his son has a history of depression and Meagan Navarro has never met her. Her fiance has a bad relationship with his ex-wife. His son Meagan Navarro is almost 9 and hasn't seen his mom in two months.  She is saying that she wants counseling for herself to alleviate her fears about blending the family. Her biggest fears are about the kids. Says the only time they argue is about the kids. They have different parenting styles.  Her fiance and her son have a really good relationship. Also, her relationship with Meagan Navarro is a little strained. She feels he is "different" and it is difficult for her to bond with him. Meagan Navarro has had a lot of problems at school and they have been told that he is ADHD. The doctor that evaluated his son told them that Meagan Navarro needs to be in a special school and his father did not like that recommendation. She and Meagan Navarro want to have more kids and Latressa is concerned that their parenting differences. Meagan Navarro feels that if he just gets his son involved with sports, then he will be fine. Meanwhile his school performance is terrible. Dorrie is concerned that Meagan Navarro may also be a bad influence on Meagan Navarro. We talked about how to interact with her son when Meagan Navarro says or does inappropriate behavior. She wants to continue with treatment to identify most significant challenges and discuss and develop strategies to manage. No significant mental health issues noted  or identified. States her distress on initial paperwork was due to a medication which has now been reduced.     Goals: Patient states that her goal of treatment is to reduce her anxiety/worry related to becoming a blended family when she and her boyfriend marry. In particular, she is seeking ways to improve communication and collaboration related to their different parenting styles. Will utilize both individual and conjoint therapy to provide psycho-educational feedback and provide strategies for improved communication and relaxation. Goal date is 12-24.  Diagnoses:  Adjustment Disorder with Anxiety  Plan of Care: Outpatient psychotherapy. Session was video (WebEx) with patient at home and provider in his home office. Session note: Busy with wedding, school sports,and work. She says that she signed Meagan Navarro up for therapy to help him adjust to the new family dynamic. Meagan Navarro had a birthday and his mom called. They shared with her what is happening with hi. She came to  his school today and they told her what is happening with him at school. Ilianna and her fiance want to have [place] change schools because it will be in his best interest. They feel that Dolan Amen mom will resist the change they want to make. Riannah spoke with her on the phone yesterday and told her how she felt about her lack of involvement.  Meagan Navarro is also now in tutoring and is at grade level. His father (JP) is now open to [place] going on medication, but does not want him in a special school.  Antoine says she finds herself "yelling a lot" and wants to stop. Says she is very patient and it takes a lot to get her to that point. Suggested she warn the kids when she is about to yell and give them an opportunity to respond to her before that happens. We talked about zero tolerance for hitting (any physical aggression) or disrespect (name calling, etc.).       Marcelina Morel, PhD  10:40a-11:30a 50 minutes

## 2024-04-13 ENCOUNTER — Other Ambulatory Visit (HOSPITAL_COMMUNITY): Payer: Self-pay

## 2024-04-13 ENCOUNTER — Other Ambulatory Visit: Payer: Self-pay

## 2024-04-13 ENCOUNTER — Encounter (HOSPITAL_COMMUNITY): Payer: Self-pay

## 2024-04-13 DIAGNOSIS — O020 Blighted ovum and nonhydatidiform mole: Secondary | ICD-10-CM

## 2024-04-13 NOTE — Progress Notes (Signed)
 SDW CALL  Patient was given pre-op instructions over the phone. The opportunity was given for the patient to ask questions. No further questions asked. Patient verbalized understanding of instructions given.  Date & arrival time- January 26, 206 @ 9:30 am   PCP - NO PCP Cardiologist -   PPM/ICD - denies Device Orders - n/a Rep Notified - n/a  Chest x-ray - denies EKG - denies Stress Test - denies ECHO - denies Cardiac Cath - denies  Sleep Study - denies CPAP - n/a  DM -denies  Blood Thinner Instructions:denies Aspirin Instructions:denies  ERAS Protcol -clear Liquids until 9:00 am   COVID TEST- n/a   Anesthesia review: no  Patient denies shortness of breath, fever, cough and chest pain over the phone call   All instructions explained to the patient, with a verbal understanding of the material. Patient agrees to go over the instructions while at home for a better understanding.

## 2024-04-13 NOTE — H&P (Signed)
 Meagan Navarro is an 29 y.o. female [phone] presenting for ultrasound guided dilation and evacuation for concern for molar pregnancy.  Meagan Navarro last seen in the office on 04/13/23 with US  performed that showed no identifiable GS or YS seen. uterine cavity with heterogeneous, complex cystic appearance snowstorm pattern concerning for molar pregnancy. No changed to medical or surgical history since then, or changes to medications.  Meagan Navarro denies N/V, abdominal pain, CP, SOB or heart racing    Past Medical History:  Diagnosis Date   Medical history non-contributory      Past Surgical History:  Procedure Laterality Date   ectopic pregnacny     WRIST SURGERY     Right    Family History  Problem Relation Age of Onset   Alcohol abuse Paternal Uncle    Drug abuse Paternal Uncle        IV drugs   HIV Paternal Uncle     Social History:  reports that Meagan Navarro has never smoked. Meagan Navarro has never used smokeless tobacco. Meagan Navarro reports that Meagan Navarro does not drink alcohol and does not use drugs.  Allergies: Allergies[1]  Medications Prior to Admission  Medication Sig Dispense Refill Last Dose/Taking   Biotin 5000 MCG TABS Take 5,000 mcg by mouth daily.   04/13/2024   Probiotic Product (PROBIOTIC DAILY PO) Take 1 capsule by mouth daily.   04/13/2024    Review of Systems  Constitutional:  Negative for chills and fever.  Respiratory:  Negative for cough and shortness of breath.   Cardiovascular:  Negative for chest pain and palpitations.  Gastrointestinal:  Negative for diarrhea, nausea and vomiting.  Genitourinary:  Negative for dysuria and vaginal bleeding.  Skin:  Negative for rash.  Neurological:  Negative for syncope, weakness and headaches.  Psychiatric/Behavioral:  The Meagan Navarro is not nervous/anxious.     Blood pressure 101/73, pulse 80, temperature 98.5 F (36.9 C), temperature source Oral, resp. rate 18, height 5' 7 (1.702 m), weight 80.7 kg, SpO2 96%, unknown if currently breastfeeding.    Physical Exam Constitutional:      Appearance: Normal appearance.  HENT:     Head: Normocephalic.     Mouth/Throat:     Mouth: Mucous membranes are moist.  Eyes:     Pupils: Pupils are equal, round, and reactive to light.  Pulmonary:     Effort: Pulmonary effort is normal.  Abdominal:     Palpations: Abdomen is soft.     Tenderness: There is no abdominal tenderness.  Musculoskeletal:        General: Normal range of motion.     Cervical back: Normal range of motion.  Skin:    General: Skin is warm.  Neurological:     General: No focal deficit present.     Mental Status: Meagan Navarro is alert and oriented to person, place, and time.  Psychiatric:        Mood and Affect: Mood normal.        Behavior: Behavior normal.     Results for orders placed or performed during the hospital encounter of 04/16/24 (from the past 24 hours)  Type and screen [facility]     Status: None (Preliminary result)   Collection Time: 04/16/24 10:30 AM  Result Value Ref Range   ABO/RH(D) PENDING    Antibody Screen PENDING    Sample Expiration      04/19/2024,2359 Performed at [facility], [address], [place], [place] [address]   CBC     Status:  None   Collection Time: 04/16/24 10:30 AM  Result Value Ref Range   WBC 6.4 4.0 - 10.5 K/uL   RBC 4.03 3.87 - 5.11 MIL/uL   Hemoglobin 13.3 12.0 - 15.0 g/dL   HCT 61.9 63.9 - 53.9 %   MCV 94.3 80.0 - 100.0 fL   MCH 33.0 26.0 - 34.0 pg   MCHC 35.0 30.0 - 36.0 g/dL   RDW 86.9 88.4 - 84.4 %   Platelets 265 150 - 400 K/uL   nRBC 0.0 0.0 - 0.2 %     Assessment/Plan: 29 y.o. [id number] admitted for ultrasound guided dilation and evacuation for concern for molar pregnancy.  R/B/A discussed. Surgical risks include bleeding, infection, uterine perforation, injury to surrounding organs, possible need for further surgery. Risk of Asherman's which may affect future fertility. Need for hysterectomy in the event of life threatening bleeding.  Meagan Navarro will accept blood products.   Plan: - Surgical consent discussed and signed - CBC, CMP, and T&S ordered  - Pre-op meds: 1g Tylenol , 200 mg PO doxycyline 1hr pre-procedure - Specimen to pathology - Plan for 1g TXA at start of case  - Anticipate d/c home from PACU  Charmaine CHRISTELLA Oz 04/16/2024, 10:47 AM      [1] No Known Allergies

## 2024-04-16 ENCOUNTER — Encounter (HOSPITAL_COMMUNITY): Admission: RE | Disposition: A | Payer: Self-pay | Source: Home / Self Care

## 2024-04-16 ENCOUNTER — Ambulatory Visit (HOSPITAL_COMMUNITY): Admitting: Anesthesiology

## 2024-04-16 ENCOUNTER — Ambulatory Visit (HOSPITAL_COMMUNITY): Admission: RE | Admit: 2024-04-16 | Discharge: 2024-04-16

## 2024-04-16 ENCOUNTER — Ambulatory Visit (HOSPITAL_COMMUNITY): Admission: RE | Admit: 2024-04-16 | Discharge: 2024-04-16 | Disposition: A

## 2024-04-16 ENCOUNTER — Other Ambulatory Visit: Payer: Self-pay

## 2024-04-16 ENCOUNTER — Encounter (HOSPITAL_COMMUNITY): Payer: Self-pay

## 2024-04-16 DIAGNOSIS — O020 Blighted ovum and nonhydatidiform mole: Secondary | ICD-10-CM

## 2024-04-16 HISTORY — DX: Other specified health status: Z78.9

## 2024-04-16 LAB — COMPREHENSIVE METABOLIC PANEL WITH GFR
ALT: 11 U/L (ref 0–44)
AST: 28 U/L (ref 15–41)
Albumin: 4.1 g/dL (ref 3.5–5.0)
Alkaline Phosphatase: 37 U/L — ABNORMAL LOW (ref 38–126)
Anion gap: 11 (ref 5–15)
BUN: 14 mg/dL (ref 6–20)
CO2: 21 mmol/L — ABNORMAL LOW (ref 22–32)
Calcium: 8.4 mg/dL — ABNORMAL LOW (ref 8.9–10.3)
Chloride: 103 mmol/L (ref 98–111)
Creatinine, Ser: 0.74 mg/dL (ref 0.44–1.00)
GFR, Estimated: >60 mL/min
Glucose, Bld: 106 mg/dL — ABNORMAL HIGH (ref 70–99)
Potassium: 4.2 mmol/L (ref 3.5–5.1)
Sodium: 134 mmol/L — ABNORMAL LOW (ref 135–145)
Total Bilirubin: 0.3 mg/dL (ref 0.0–1.2)
Total Protein: 7 g/dL (ref 6.5–8.1)

## 2024-04-16 LAB — TYPE AND SCREEN
ABO/RH(D): A POS
Antibody Screen: NEGATIVE

## 2024-04-16 LAB — CBC
HCT: 38 % (ref 36.0–46.0)
Hemoglobin: 13.3 g/dL (ref 12.0–15.0)
MCH: 33 pg (ref 26.0–34.0)
MCHC: 35 g/dL (ref 30.0–36.0)
MCV: 94.3 fL (ref 80.0–100.0)
Platelets: 265 10*3/uL (ref 150–400)
RBC: 4.03 MIL/uL (ref 3.87–5.11)
RDW: 13 % (ref 11.5–15.5)
WBC: 6.4 10*3/uL (ref 4.0–10.5)
nRBC: 0 % (ref 0.0–0.2)

## 2024-04-16 MED ORDER — DEXAMETHASONE SOD PHOSPHATE PF 10 MG/ML IJ SOLN
INTRAMUSCULAR | Status: DC | PRN
  Administered 2024-04-16: 10 mg via INTRAVENOUS

## 2024-04-16 MED ORDER — SOD CITRATE-CITRIC ACID 500-334 MG/5ML PO SOLN
30.0000 mL | ORAL | Status: AC
  Administered 2024-04-16: 30 mL via ORAL
  Filled 2024-04-16: qty 30

## 2024-04-16 MED ORDER — FENTANYL CITRATE (PF) 100 MCG/2ML IJ SOLN
INTRAMUSCULAR | Status: DC | PRN
  Administered 2024-04-16 (×2): 50 ug via INTRAVENOUS
  Administered 2024-04-16: 100 ug via INTRAVENOUS

## 2024-04-16 MED ORDER — LIDOCAINE 2% (20 MG/ML) 5 ML SYRINGE
INTRAMUSCULAR | Status: AC
  Filled 2024-04-16: qty 10

## 2024-04-16 MED ORDER — FENTANYL CITRATE (PF) 100 MCG/2ML IJ SOLN: 25.0000 ug | INTRAMUSCULAR | Status: DC | PRN

## 2024-04-16 MED ORDER — IBUPROFEN 200 MG PO CAPS: 600.0000 mg | ORAL_CAPSULE | Freq: Four times a day (QID) | ORAL | Status: AC | PRN

## 2024-04-16 MED ORDER — OXYCODONE HCL 5 MG/5ML PO SOLN: 5.0000 mg | Freq: Once | ORAL | Status: DC | PRN

## 2024-04-16 MED ORDER — ONDANSETRON HCL 4 MG/2ML IJ SOLN
INTRAMUSCULAR | Status: DC | PRN
  Administered 2024-04-16: 4 mg via INTRAVENOUS

## 2024-04-16 MED ORDER — ACETAMINOPHEN 325 MG PO TABS: 650.0000 mg | ORAL_TABLET | Freq: Four times a day (QID) | ORAL | Status: AC | PRN

## 2024-04-16 MED ORDER — DOXYCYCLINE HYCLATE 100 MG PO TABS
200.0000 mg | ORAL_TABLET | Freq: Once | ORAL | Status: AC
  Administered 2024-04-16: 200 mg via ORAL
  Filled 2024-04-16 (×2): qty 2

## 2024-04-16 MED ORDER — PHENYLEPHRINE 80 MCG/ML (10ML) SYRINGE FOR IV PUSH (FOR BLOOD PRESSURE SUPPORT)
PREFILLED_SYRINGE | INTRAVENOUS | Status: AC
  Filled 2024-04-16: qty 10

## 2024-04-16 MED ORDER — FENTANYL CITRATE (PF) 250 MCG/5ML IJ SOLN
INTRAMUSCULAR | Status: AC
  Filled 2024-04-16: qty 5

## 2024-04-16 MED ORDER — MIDAZOLAM HCL 5 MG/5ML IJ SOLN
INTRAMUSCULAR | Status: DC | PRN
  Administered 2024-04-16: 2 mg via INTRAVENOUS

## 2024-04-16 MED ORDER — PROPOFOL 10 MG/ML IV BOLUS
INTRAVENOUS | Status: AC
  Filled 2024-04-16: qty 20

## 2024-04-16 MED ORDER — DEXAMETHASONE SOD PHOSPHATE PF 10 MG/ML IJ SOLN
INTRAMUSCULAR | Status: AC
  Filled 2024-04-16: qty 2

## 2024-04-16 MED ORDER — MIDAZOLAM HCL 2 MG/2ML IJ SOLN
INTRAMUSCULAR | Status: AC
  Filled 2024-04-16: qty 2

## 2024-04-16 MED ORDER — TRANEXAMIC ACID-NACL 1000-0.7 MG/100ML-% IV SOLN
1000.0000 mg | Freq: Once | INTRAVENOUS | Status: AC
  Administered 2024-04-16: 1000 mg via INTRAVENOUS
  Filled 2024-04-16: qty 100

## 2024-04-16 MED ORDER — LIDOCAINE HCL 2 % IJ SOLN
INTRAMUSCULAR | Status: DC | PRN
  Administered 2024-04-16: 20 mL

## 2024-04-16 MED ORDER — SODIUM CHLORIDE 0.9 % IR SOLN
Status: DC | PRN
  Administered 2024-04-16: 1000 mL

## 2024-04-16 MED ORDER — ONDANSETRON HCL 4 MG/2ML IJ SOLN: 4.0000 mg | Freq: Four times a day (QID) | INTRAMUSCULAR | Status: DC | PRN

## 2024-04-16 MED ORDER — PROPOFOL 10 MG/ML IV BOLUS
INTRAVENOUS | Status: DC | PRN
  Administered 2024-04-16: 150 mg via INTRAVENOUS
  Administered 2024-04-16: 50 mg via INTRAVENOUS

## 2024-04-16 MED ORDER — ONDANSETRON HCL 4 MG/2ML IJ SOLN
INTRAMUSCULAR | Status: AC
  Filled 2024-04-16: qty 4

## 2024-04-16 MED ORDER — CHLORHEXIDINE GLUCONATE 0.12 % MT SOLN
OROMUCOSAL | Status: AC
  Administered 2024-04-16: 15 mL
  Filled 2024-04-16: qty 15

## 2024-04-16 MED ORDER — OXYCODONE HCL 5 MG PO TABS: 5.0000 mg | ORAL_TABLET | Freq: Once | ORAL | Status: DC | PRN

## 2024-04-16 MED ORDER — ACETAMINOPHEN 500 MG PO TABS
1000.0000 mg | ORAL_TABLET | ORAL | Status: AC
  Administered 2024-04-16: 1000 mg via ORAL
  Filled 2024-04-16: qty 2

## 2024-04-16 MED ORDER — MISOPROSTOL 100 MCG PO TABS
ORAL_TABLET | ORAL | Status: DC | PRN
  Administered 2024-04-16: 800 ug via RECTAL

## 2024-04-16 MED ORDER — SUCCINYLCHOLINE CHLORIDE 200 MG/10ML IV SOSY
PREFILLED_SYRINGE | INTRAVENOUS | Status: AC
  Filled 2024-04-16: qty 10

## 2024-04-16 MED ORDER — LACTATED RINGERS IV SOLN: INTRAVENOUS | Status: DC

## 2024-04-16 MED ORDER — LIDOCAINE 2% (20 MG/ML) 5 ML SYRINGE
INTRAMUSCULAR | Status: DC | PRN
  Administered 2024-04-16: 60 mg via INTRAVENOUS

## 2024-04-16 NOTE — Op Note (Signed)
"       Operative Note  PATIENT:  Meagan Navarro  29 y.o. female  PRE-OPERATIVE DIAGNOSIS:  Molar Pregnancy  POST-OPERATIVE DIAGNOSIS:  Molar Pregnancy   PROCEDURE:  Procedures: DILATION AND EVACUATION, UTERUS (N/A) US  INTRAOPERATIVE (N/A)  SURGEON:  Surgeons and Role:    * Lane Charmaine HERO, MD - Primary   ANESTHESIA:   general  ANTIBIOTICS: 200 mg PO doxycyline pre-procedure   EBL:  100 cc  LOCAL MEDICATIONS USED:  10 cc 2% lidocaine    SPECIMEN:    ID Type Source Tests Collected by Time Destination  A : Products of conecption Products of Conception PATH POC POC/TISSUE MICROARRAY Lane Charmaine HERO, MD 04/16/2024 1244     DISPOSITION OF SPECIMEN:  PATHOLOGY   FINDINGS: Multiparous cervix, small amount of blood at the os, anteverted uterus. On US  - heterogeneous, echogenic mass present with multiple, small cystic spaces   INDICATION: . 29 year old G3 P1011 LMP 02/14/24 diagnosed with concern for molar pregnancy on US  on 04/13/24.  Risk of procedure were discussed in detail and surgical consents were signed.    PROCEDURE DETAIL: She was  taken to the operating room where general  anesthesia was administered. She received PO doxycycline  200mg  pre-procedure for infection prophylaxis. She was placed in the dorsal lithotomy position in stirrups. She was prepared and draped in normal sterile fashion. of urine was emptied via straight catheter. Time out was completed prior to starting the procedure. A speculum was inserted into the vagina and cervix was visualized.  The anterior lip of the cervix was grasped with a single-tooth tenaculum.  The rest of the procedure was performed under ultrasound guidance. The cervix was a dilated with Pratt's dilators to accommodate a 10 mm suction curette.The suction curette was advanced to the uterine fundus. The suction was then started. The contents of the uterus were evacuated with the curette rotating on outward movement for a  total of six passes. A sharp gentle curettage was then performed. Two final passes with the suction curette removed the remaining debris. The tenaculum was removed from the cervix and the puncture sites were noted to be hemostatic. Brisk bleeding from cervical os was noted. Bimanual massage was performed and good hemostasis was then noted at the cervical os. 800 mcg rectal misoprostol  was laced at the end of the case. The patient tolerated the procedure well. Counts were correct times two. The patient was awakened and taken to the recovery room in stable condition.   Her blood type is A positive and she will not need Rhogam prior to discharge.   PATIENT DISPOSITION:  PACU - hemodynamically stable.  PLAN OF CARE: Discharge to home after PACU   Charmaine Lane, MD  04/13/2024 1:12 PM  "

## 2024-04-16 NOTE — Anesthesia Preprocedure Evaluation (Signed)
 Anesthesia Evaluation  Patient identified by MRN, date of birth, ID band Patient awake    Reviewed: Allergy & Precautions, H&P , NPO status , Patient's Chart, lab work & pertinent test results  Airway Mallampati: II   Neck ROM: full    Dental   Pulmonary neg pulmonary ROS   breath sounds clear to auscultation       Cardiovascular negative cardio ROS  Rhythm:regular Rate:Normal     Neuro/Psych    GI/Hepatic   Endo/Other    Renal/GU      Musculoskeletal   Abdominal   Peds  Hematology   Anesthesia Other Findings   Reproductive/Obstetrics                             Anesthesia Physical Anesthesia Plan  ASA: 2  Anesthesia Plan: General   Post-op Pain Management:    Induction: Intravenous  PONV Risk Score and Plan: 3 and Ondansetron, Dexamethasone, Midazolam and Treatment may vary due to age or medical condition  Airway Management Planned: LMA  Additional Equipment:   Intra-op Plan:   Post-operative Plan: Extubation in OR  Informed Consent: I have reviewed the patients History and Physical, chart, labs and discussed the procedure including the risks, benefits and alternatives for the proposed anesthesia with the patient or authorized representative who has indicated his/her understanding and acceptance.     Dental advisory given  Plan Discussed with: CRNA, Anesthesiologist and Surgeon  Anesthesia Plan Comments:        Anesthesia Quick Evaluation

## 2024-04-16 NOTE — Transfer of Care (Signed)
 Immediate Anesthesia Transfer of Care Note  Patient: Meagan Navarro  Procedure(s) Performed: DILATION AND EVACUATION, UTERUS (Uterus) US  INTRAOPERATIVE (Uterus)  Patient Location: PACU  Anesthesia Type:General  Level of Consciousness: awake, alert , oriented, and patient cooperative  Airway & Oxygen Therapy: Patient Spontanous Breathing  Post-op Assessment: Report given to RN, Post -op Vital signs reviewed and stable, and Patient moving all extremities  Post vital signs: Reviewed and stable  Last Vitals:  Vitals Value Taken Time  BP    Temp    Pulse 99 04/16/24 13:14  Resp 14 04/16/24 13:14  SpO2 99 % 04/16/24 13:14  Vitals shown include unfiled device data.  Last Pain:  Vitals:   04/16/24 1000  TempSrc:   PainSc: 0-No pain      Patients Stated Pain Goal: 0 (04/16/24 1000)  Complications: No notable events documented.

## 2024-04-16 NOTE — Anesthesia Procedure Notes (Signed)
 Procedure Name: LMA Insertion Date/Time: 04/16/2024 12:24 PM  Performed by: Ralphie Lovelady L, CRNAPre-anesthesia Checklist: Patient identified, Emergency Drugs available, Suction available and Patient being monitored Patient Re-evaluated:Patient Re-evaluated prior to induction Oxygen Delivery Method: Circle System Utilized Preoxygenation: Pre-oxygenation with 100% oxygen Induction Type: IV induction Ventilation: Mask ventilation without difficulty LMA: LMA inserted LMA Size: 4.0 Number of attempts: 1 Airway Equipment and Method: Bite block Placement Confirmation: positive ETCO2 Tube secured with: Tape Dental Injury: Teeth and Oropharynx as per pre-operative assessment

## 2024-04-16 NOTE — Anesthesia Postprocedure Evaluation (Signed)
"   Anesthesia Post Note  Patient: Meagan Navarro  Procedure(s) Performed: DILATION AND EVACUATION, UTERUS (Uterus) US  INTRAOPERATIVE (Uterus)     Patient location during evaluation: PACU Anesthesia Type: General Level of consciousness: awake and alert Pain management: pain level controlled Vital Signs Assessment: post-procedure vital signs reviewed and stable Respiratory status: spontaneous breathing, nonlabored ventilation, respiratory function stable and patient connected to nasal cannula oxygen Cardiovascular status: blood pressure returned to baseline and stable Postop Assessment: no apparent nausea or vomiting Anesthetic complications: no   There were no known notable events for this encounter.  Last Vitals:  Vitals:   04/16/24 1330 04/16/24 1345  BP: (!) 120/91 108/66  Pulse: 87 78  Resp: 15 (!) 23  Temp:  36.5 C  SpO2: 100% 100%    Last Pain:  Vitals:   04/16/24 1315  TempSrc:   PainSc: 2                  Alberta Cairns S      "

## 2024-04-17 ENCOUNTER — Encounter (HOSPITAL_COMMUNITY): Payer: Self-pay

## 2024-04-25 LAB — SURGICAL PATHOLOGY
